# Patient Record
Sex: Female | Born: 1985
Health system: Southern US, Community
[De-identification: ages and names within clinical notes are randomized; demographics above are authoritative.]

## PROBLEM LIST (undated history)

## (undated) ENCOUNTER — Inpatient Hospital Stay (HOSPITAL_COMMUNITY): Payer: Self-pay

## (undated) DIAGNOSIS — J449 Chronic obstructive pulmonary disease, unspecified: Secondary | ICD-10-CM

## (undated) DIAGNOSIS — F319 Bipolar disorder, unspecified: Secondary | ICD-10-CM

## (undated) DIAGNOSIS — F419 Anxiety disorder, unspecified: Secondary | ICD-10-CM

## (undated) DIAGNOSIS — S82891A Other fracture of right lower leg, initial encounter for closed fracture: Secondary | ICD-10-CM

## (undated) DIAGNOSIS — S6291XA Unspecified fracture of right wrist and hand, initial encounter for closed fracture: Secondary | ICD-10-CM

## (undated) DIAGNOSIS — F4310 Post-traumatic stress disorder, unspecified: Secondary | ICD-10-CM

## (undated) HISTORY — PX: DILATION AND CURETTAGE OF UTERUS: SHX78

## (undated) HISTORY — PX: HEMORRHOID SURGERY: SHX153

## (undated) HISTORY — PX: OTHER SURGICAL HISTORY: SHX169

## (undated) HISTORY — PX: SHOULDER SURGERY: SHX246

---
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
## 2015-03-13 ENCOUNTER — Inpatient Hospital Stay (HOSPITAL_COMMUNITY)
Admission: AD | Admit: 2015-03-13 | Discharge: 2015-03-13 | Disposition: A | Discharge status: Home / Self Care | Payer: 59 | Source: Ambulatory Visit | Attending: Obstetrics and Gynecology | Admitting: Obstetrics and Gynecology

## 2015-03-13 ENCOUNTER — Encounter (HOSPITAL_COMMUNITY): Payer: Self-pay | Admitting: *Deleted

## 2015-03-13 DIAGNOSIS — Z3A08 8 weeks gestation of pregnancy: Secondary | ICD-10-CM | POA: Insufficient documentation

## 2015-03-13 DIAGNOSIS — R05 Cough: Secondary | ICD-10-CM | POA: Insufficient documentation

## 2015-03-13 DIAGNOSIS — Z87891 Personal history of nicotine dependence: Secondary | ICD-10-CM | POA: Insufficient documentation

## 2015-03-13 DIAGNOSIS — O210 Mild hyperemesis gravidarum: Secondary | ICD-10-CM | POA: Diagnosis not present

## 2015-03-13 DIAGNOSIS — O9989 Other specified diseases and conditions complicating pregnancy, childbirth and the puerperium: Secondary | ICD-10-CM | POA: Insufficient documentation

## 2015-03-13 DIAGNOSIS — R0981 Nasal congestion: Secondary | ICD-10-CM | POA: Diagnosis present

## 2015-03-13 HISTORY — DX: Anxiety disorder, unspecified: F41.9

## 2015-03-13 HISTORY — DX: Post-traumatic stress disorder, unspecified: F43.10

## 2015-03-13 HISTORY — DX: Unspecified fracture of right wrist and hand, initial encounter for closed fracture: S62.91XA

## 2015-03-13 HISTORY — DX: Other fracture of right lower leg, initial encounter for closed fracture: S82.891A

## 2015-03-13 LAB — URINE MICROSCOPIC-ADD ON

## 2015-03-13 LAB — URINALYSIS, ROUTINE W REFLEX MICROSCOPIC
Bilirubin Urine: NEGATIVE
Glucose, UA: NEGATIVE mg/dL
KETONES UR: 15 mg/dL — AB
LEUKOCYTES UA: NEGATIVE
NITRITE: NEGATIVE
PH: 7 (ref 5.0–8.0)
PROTEIN: NEGATIVE mg/dL
Specific Gravity, Urine: 1.02 (ref 1.005–1.030)
Urobilinogen, UA: 0.2 mg/dL (ref 0.0–1.0)

## 2015-03-13 MED ORDER — PROMETHAZINE HCL 25 MG/ML IJ SOLN
25.0000 mg | Freq: Once | INTRAVENOUS | Status: AC
  Administered 2015-03-13: 25 mg via INTRAVENOUS
  Filled 2015-03-13: qty 1

## 2015-03-13 MED ORDER — PROMETHAZINE HCL 25 MG PO TABS: 12.5000 mg | ORAL_TABLET | Freq: Four times a day (QID) | ORAL | Status: DC | PRN

## 2015-03-13 NOTE — Discharge Instructions (Signed)
Take the phenergan for nausea and be sure you are drinking plenty of fluids, call the office for a follow up appointment. Return here as needed.

## 2015-03-13 NOTE — MAU Note (Addendum)
Pre-existing problem with n/v.  Got a sinus infection and now feels worse, can't even keep sprite down. Is on rx for sinus infection. Had temp up to 102 on Sunday
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
## 2015-03-14 ENCOUNTER — Inpatient Hospital Stay (HOSPITAL_COMMUNITY)
Admission: AD | Admit: 2015-03-14 | Discharge: 2015-03-15 | Disposition: A | Discharge status: Home / Self Care | Payer: 59 | Source: Ambulatory Visit | Attending: Obstetrics and Gynecology | Admitting: Obstetrics and Gynecology

## 2015-03-14 DIAGNOSIS — R748 Abnormal levels of other serum enzymes: Secondary | ICD-10-CM | POA: Insufficient documentation

## 2015-03-14 DIAGNOSIS — Z87891 Personal history of nicotine dependence: Secondary | ICD-10-CM | POA: Insufficient documentation

## 2015-03-14 DIAGNOSIS — Z3A08 8 weeks gestation of pregnancy: Secondary | ICD-10-CM | POA: Insufficient documentation

## 2015-03-14 DIAGNOSIS — O211 Hyperemesis gravidarum with metabolic disturbance: Secondary | ICD-10-CM | POA: Diagnosis not present

## 2015-03-14 DIAGNOSIS — O219 Vomiting of pregnancy, unspecified: Secondary | ICD-10-CM

## 2015-03-14 DIAGNOSIS — O210 Mild hyperemesis gravidarum: Secondary | ICD-10-CM | POA: Diagnosis present

## 2015-03-14 LAB — URINALYSIS, ROUTINE W REFLEX MICROSCOPIC
BILIRUBIN URINE: NEGATIVE
Glucose, UA: NEGATIVE mg/dL
HGB URINE DIPSTICK: NEGATIVE
KETONES UR: 15 mg/dL — AB
Leukocytes, UA: NEGATIVE
Nitrite: NEGATIVE
PH: 6 (ref 5.0–8.0)
Protein, ur: NEGATIVE mg/dL
Specific Gravity, Urine: 1.025 (ref 1.005–1.030)
UROBILINOGEN UA: 1 mg/dL (ref 0.0–1.0)

## 2015-03-14 LAB — CBC
HEMATOCRIT: 38.9 % (ref 36.0–46.0)
HEMOGLOBIN: 13.4 g/dL (ref 12.0–15.0)
MCH: 29.1 pg (ref 26.0–34.0)
MCHC: 34.4 g/dL (ref 30.0–36.0)
MCV: 84.6 fL (ref 78.0–100.0)
Platelets: 240 10*3/uL (ref 150–400)
RBC: 4.6 MIL/uL (ref 3.87–5.11)
RDW: 12.7 % (ref 11.5–15.5)
WBC: 9.1 10*3/uL (ref 4.0–10.5)

## 2015-03-14 MED ORDER — LACTATED RINGERS IV BOLUS (SEPSIS)
1000.0000 mL | Freq: Once | INTRAVENOUS | Status: AC
  Administered 2015-03-14: 1000 mL via INTRAVENOUS

## 2015-03-14 MED ORDER — ONDANSETRON 8 MG/NS 50 ML IVPB
8.0000 mg | Freq: Once | INTRAVENOUS | Status: AC
  Administered 2015-03-14: 8 mg via INTRAVENOUS
  Filled 2015-03-14: qty 8
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
## 2015-03-14 NOTE — MAU Note (Signed)
Denies abdominal pain or vaginal bleeding. Upper abdominal soreness r/t vomiting so much.  Vomiting since Sunday; lost count of how many times vomited in the last day.  Taking reglan & phenergan at home with no relief.  Denies diarrhea.

## 2015-03-15 DIAGNOSIS — O219 Vomiting of pregnancy, unspecified: Secondary | ICD-10-CM | POA: Diagnosis not present

## 2015-03-15 LAB — COMPREHENSIVE METABOLIC PANEL
ALK PHOS: 63 U/L (ref 39–117)
ALT: 78 U/L — AB (ref 0–35)
AST: 78 U/L — AB (ref 0–37)
Albumin: 4.5 g/dL (ref 3.5–5.2)
Anion gap: 8 (ref 5–15)
BUN: 7 mg/dL (ref 6–23)
CHLORIDE: 104 mmol/L (ref 96–112)
CO2: 25 mmol/L (ref 19–32)
Calcium: 9.3 mg/dL (ref 8.4–10.5)
Creatinine, Ser: 0.48 mg/dL — ABNORMAL LOW (ref 0.50–1.10)
GFR calc Af Amer: >90 mL/min (ref >90)
GFR calc non Af Amer: >90 mL/min (ref >90)
Glucose, Bld: 99 mg/dL (ref 70–99)
Potassium: 3.5 mmol/L (ref 3.5–5.1)
Sodium: 137 mmol/L (ref 135–145)
Total Bilirubin: 0.8 mg/dL (ref 0.3–1.2)
Total Protein: 7.2 g/dL (ref 6.0–8.3)

## 2015-03-15 LAB — HEPATITIS PANEL, ACUTE
HCV Ab: NEGATIVE
HEP B C IGM: NONREACTIVE
HEP B S AG: NEGATIVE
Hep A IgM: NONREACTIVE

## 2015-03-15 LAB — TSH: TSH: 0.109 u[IU]/mL — ABNORMAL LOW (ref 0.350–4.500)

## 2015-03-15 MED ORDER — ONDANSETRON 8 MG PO TBDP: 8.0000 mg | ORAL_TABLET | Freq: Three times a day (TID) | ORAL | Status: DC | PRN

## 2015-03-15 NOTE — Discharge Instructions (Signed)
Your liver enzymes were slightly elevated today. This is likely related to your nausea and vomiting, but we are running tests for liver disease as well. Your doctor should retest you liver enzymes in the next month to see if have normalized.   Hyperemesis Gravidarum Hyperemesis gravidarum is a severe form of nausea and vomiting that happens during pregnancy. Hyperemesis is worse than morning sickness. It may cause you to have nausea or vomiting all day for many days. It may keep you from eating and drinking enough food and liquids. Hyperemesis usually occurs during the first half (the first 20 weeks) of pregnancy. It often goes away once a woman is in her second half of pregnancy. However, sometimes hyperemesis continues through an entire pregnancy.  CAUSES  The cause of this condition is not completely known but is thought to be related to changes in the body's hormones when pregnant. It could be from the high level of the pregnancy hormone or an increase in estrogen in the body.  SIGNS AND SYMPTOMS   Severe nausea and vomiting.  Nausea that does not go away.  Vomiting that does not allow you to keep any food down.  Weight loss and body fluid loss (dehydration).  Having no desire to eat or not liking food you have previously enjoyed. DIAGNOSIS  Your health care provider will do a physical exam and ask you about your symptoms. He or she may also order blood tests and urine tests to make sure something else is not causing the problem.  TREATMENT  You may only need medicine to control the problem. If medicines do not control the nausea and vomiting, you will be treated in the hospital to prevent dehydration, increased acid in the blood (acidosis), weight loss, and changes in the electrolytes in your body that may harm the unborn baby (fetus). You may need IV fluids.  HOME CARE INSTRUCTIONS   Only take over-the-counter or prescription medicines as directed by your health care provider.  Try  eating a couple of dry crackers or toast in the morning before getting out of bed.  Avoid foods and smells that upset your stomach.  Avoid fatty and spicy foods.  Eat 5-6 small meals a day.  Do not drink when eating meals. Drink between meals.  For snacks, eat high-protein foods, such as cheese.  Eat or suck on things that have ginger in them. Ginger helps nausea.  Avoid food preparation. The smell of food can spoil your appetite.  Avoid iron pills and iron in your multivitamins until after 3-4 months of being pregnant. However, consult with your health care provider before stopping any prescribed iron pills. SEEK MEDICAL CARE IF:   Your abdominal pain increases.  You have a severe headache.  You have vision problems.  You are losing weight. SEEK IMMEDIATE MEDICAL CARE IF:   You are unable to keep fluids down.  You vomit blood.  You have constant nausea and vomiting.  You have excessive weakness.  You have extreme thirst.  You have dizziness or fainting.  You have a fever or persistent symptoms for more than 2-3 days.  You have a fever and your symptoms suddenly get worse. MAKE SURE YOU:   Understand these instructions.  Will watch your condition.  Will get help right away if you are not doing well or get worse. Document Released: 12/15/2005 Document Revised: 10/05/2013 Document Reviewed: 07/27/2013 [facility] Patient Information 2015 [employer]. This information is not intended to replace advice given to you by  your health care provider. Make sure you discuss any questions you have with your health care provider.

## 2015-04-03 LAB — OB RESULTS CONSOLE GC/CHLAMYDIA
Chlamydia: NEGATIVE
GC PROBE AMP, GENITAL: NEGATIVE

## 2015-04-03 LAB — OB RESULTS CONSOLE RUBELLA ANTIBODY, IGM: RUBELLA: IMMUNE

## 2015-04-03 LAB — OB RESULTS CONSOLE ABO/RH: RH TYPE: POSITIVE

## 2015-04-03 LAB — OB RESULTS CONSOLE HIV ANTIBODY (ROUTINE TESTING): HIV: NONREACTIVE

## 2015-04-03 LAB — OB RESULTS CONSOLE RPR: RPR: NONREACTIVE

## 2015-04-03 LAB — OB RESULTS CONSOLE ANTIBODY SCREEN: ANTIBODY SCREEN: NEGATIVE

## 2015-05-10 ENCOUNTER — Inpatient Hospital Stay (HOSPITAL_COMMUNITY): Payer: 59

## 2015-05-10 ENCOUNTER — Encounter (HOSPITAL_COMMUNITY): Payer: Self-pay | Admitting: *Deleted

## 2015-05-10 ENCOUNTER — Inpatient Hospital Stay (HOSPITAL_COMMUNITY)
Admission: AD | Admit: 2015-05-10 | Discharge: 2015-05-20 | DRG: 165 | Disposition: A | Discharge status: Home / Self Care | Payer: 59 | Source: Ambulatory Visit | Attending: Surgery | Admitting: Surgery

## 2015-05-10 DIAGNOSIS — O99512 Diseases of the respiratory system complicating pregnancy, second trimester: Secondary | ICD-10-CM | POA: Diagnosis present

## 2015-05-10 DIAGNOSIS — J9382 Other air leak: Secondary | ICD-10-CM | POA: Diagnosis present

## 2015-05-10 DIAGNOSIS — F4310 Post-traumatic stress disorder, unspecified: Secondary | ICD-10-CM | POA: Diagnosis present

## 2015-05-10 DIAGNOSIS — O210 Mild hyperemesis gravidarum: Secondary | ICD-10-CM | POA: Diagnosis present

## 2015-05-10 DIAGNOSIS — R079 Chest pain, unspecified: Secondary | ICD-10-CM

## 2015-05-10 DIAGNOSIS — J982 Interstitial emphysema: Secondary | ICD-10-CM | POA: Diagnosis not present

## 2015-05-10 DIAGNOSIS — J9383 Other pneumothorax: Principal | ICD-10-CM | POA: Diagnosis present

## 2015-05-10 DIAGNOSIS — F17210 Nicotine dependence, cigarettes, uncomplicated: Secondary | ICD-10-CM | POA: Diagnosis present

## 2015-05-10 DIAGNOSIS — Z79899 Other long term (current) drug therapy: Secondary | ICD-10-CM

## 2015-05-10 DIAGNOSIS — J939 Pneumothorax, unspecified: Secondary | ICD-10-CM

## 2015-05-10 DIAGNOSIS — Z9689 Presence of other specified functional implants: Secondary | ICD-10-CM

## 2015-05-10 DIAGNOSIS — M79609 Pain in unspecified limb: Secondary | ICD-10-CM | POA: Diagnosis not present

## 2015-05-10 DIAGNOSIS — Z3A17 17 weeks gestation of pregnancy: Secondary | ICD-10-CM | POA: Diagnosis present

## 2015-05-10 DIAGNOSIS — R0602 Shortness of breath: Secondary | ICD-10-CM

## 2015-05-10 LAB — COMPREHENSIVE METABOLIC PANEL
ALBUMIN: 3.8 g/dL (ref 3.5–5.0)
ALT: 15 U/L (ref 14–54)
ANION GAP: 10 (ref 5–15)
AST: 16 U/L (ref 15–41)
Alkaline Phosphatase: 49 U/L (ref 38–126)
BILIRUBIN TOTAL: 0.5 mg/dL (ref 0.3–1.2)
CALCIUM: 8.7 mg/dL — AB (ref 8.9–10.3)
CHLORIDE: 103 mmol/L (ref 101–111)
CO2: 24 mmol/L (ref 22–32)
CREATININE: 0.58 mg/dL (ref 0.44–1.00)
GFR calc Af Amer: >60 mL/min (ref >60)
Glucose, Bld: 132 mg/dL — ABNORMAL HIGH (ref 65–99)
Potassium: 3.3 mmol/L — ABNORMAL LOW (ref 3.5–5.1)
Sodium: 137 mmol/L (ref 135–145)
Total Protein: 6.5 g/dL (ref 6.5–8.1)

## 2015-05-10 LAB — CBC WITH DIFFERENTIAL/PLATELET
BASOS ABS: 0 10*3/uL (ref 0.0–0.1)
Basophils Relative: 0 % (ref 0–1)
Eosinophils Absolute: 0.1 10*3/uL (ref 0.0–0.7)
Eosinophils Relative: 1 % (ref 0–5)
HEMATOCRIT: 35.6 % — AB (ref 36.0–46.0)
HEMOGLOBIN: 12.3 g/dL (ref 12.0–15.0)
LYMPHS ABS: 2.1 10*3/uL (ref 0.7–4.0)
LYMPHS PCT: 23 % (ref 12–46)
MCH: 30.1 pg (ref 26.0–34.0)
MCHC: 34.6 g/dL (ref 30.0–36.0)
MCV: 87.3 fL (ref 78.0–100.0)
Monocytes Absolute: 0.5 10*3/uL (ref 0.1–1.0)
Monocytes Relative: 5 % (ref 3–12)
NEUTROS ABS: 6.5 10*3/uL (ref 1.7–7.7)
Neutrophils Relative %: 71 % (ref 43–77)
PLATELETS: 222 10*3/uL (ref 150–400)
RBC: 4.08 MIL/uL (ref 3.87–5.11)
RDW: 14.3 % (ref 11.5–15.5)
WBC: 9.1 10*3/uL (ref 4.0–10.5)

## 2015-05-10 LAB — MRSA PCR SCREENING: MRSA by PCR: NEGATIVE

## 2015-05-10 LAB — TROPONIN I: Troponin I: <0.03 ng/mL (ref <0.031)

## 2015-05-10 MED ORDER — LACTATED RINGERS IV BOLUS (SEPSIS)
1000.0000 mL | Freq: Once | INTRAVENOUS | Status: AC
  Administered 2015-05-10: 1000 mL via INTRAVENOUS

## 2015-05-10 MED ORDER — ACETAMINOPHEN 325 MG PO TABS
650.0000 mg | ORAL_TABLET | Freq: Once | ORAL | Status: AC
Start: 2015-05-10 — End: 2015-05-10
  Administered 2015-05-10: 650 mg via ORAL
  Filled 2015-05-10: qty 2

## 2015-05-10 MED ORDER — COMPLETENATE 29-1 MG PO CHEW
1.0000 | CHEWABLE_TABLET | Freq: Every day | ORAL | Status: DC
  Filled 2015-05-10: qty 1

## 2015-05-10 MED ORDER — ONDANSETRON HCL 4 MG/2ML IJ SOLN
INTRAMUSCULAR | Status: AC
  Filled 2015-05-10: qty 2

## 2015-05-10 MED ORDER — PRENATAL MULTIVITAMIN CH
1.0000 | ORAL_TABLET | Freq: Every day | ORAL | Status: DC
  Administered 2015-05-11 – 2015-05-19 (×9): 1 via ORAL
  Filled 2015-05-10 (×12): qty 1

## 2015-05-10 MED ORDER — OXYCODONE HCL 5 MG PO TABS
5.0000 mg | ORAL_TABLET | ORAL | Status: DC | PRN
  Administered 2015-05-10 – 2015-05-12 (×7): 5 mg via ORAL
  Filled 2015-05-10 (×8): qty 1

## 2015-05-10 MED ORDER — ONDANSETRON HCL 4 MG/2ML IJ SOLN
4.0000 mg | Freq: Three times a day (TID) | INTRAMUSCULAR | Status: DC | PRN
  Administered 2015-05-10 – 2015-05-14 (×8): 4 mg via INTRAVENOUS
  Filled 2015-05-10 (×8): qty 2

## 2015-05-10 MED ORDER — LIDOCAINE HCL (PF) 1 % IJ SOLN
INTRAMUSCULAR | Status: AC
  Administered 2015-05-10: 10 mL
  Filled 2015-05-10: qty 10

## 2015-05-10 MED ORDER — ACETAMINOPHEN 325 MG PO TABS
650.0000 mg | ORAL_TABLET | Freq: Four times a day (QID) | ORAL | Status: DC | PRN
  Administered 2015-05-11 – 2015-05-13 (×5): 650 mg via ORAL
  Filled 2015-05-10 (×6): qty 2

## 2015-05-10 MED ORDER — FENTANYL CITRATE (PF) 100 MCG/2ML IJ SOLN
50.0000 ug | INTRAMUSCULAR | Status: DC | PRN
  Administered 2015-05-10 – 2015-05-15 (×35): 50 ug via INTRAVENOUS
  Filled 2015-05-10 (×34): qty 2

## 2015-05-10 MED ORDER — ONDANSETRON HCL 4 MG/2ML IJ SOLN
4.0000 mg | Freq: Once | INTRAMUSCULAR | Status: AC
  Administered 2015-05-10: 4 mg via INTRAVENOUS

## 2015-05-10 MED ORDER — PRENATAL LOW IRON 27-1 MG PO TABS
1.0000 | ORAL_TABLET | Freq: Every day | ORAL | Status: DC
  Filled 2015-05-10: qty 1

## 2015-05-10 NOTE — ED Notes (Signed)
Consulting MD at bedside

## 2015-05-10 NOTE — ED Notes (Signed)
Dr. Laneta SimmersBartle at bedside preparing to place R chest tube.

## 2015-05-10 NOTE — MAU Note (Signed)
10 L O2 via NRB applied. Pulse ox applied.
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
## 2015-05-10 NOTE — MAU Note (Signed)
EMS ETA: 10 minutes  EMS arrival :  1334 EMS departure : 1343  113/57 92 p 98 on 10L NRB  20RR

## 2015-05-10 NOTE — Progress Notes (Signed)
UR COMPLETED  

## 2015-05-10 NOTE — ED Notes (Signed)
Per [facility]- pt had sudden onset of SOB and back pain. Pt reports recent nonproductive cough in the mornings. Pt reported to have absent breath sounds on the rt. spO2 decreased with standing and HR increased. Pt on 10L simple mask. Pt is [redacted] weeks pregnant. Pt reported to have 1 episode of vomiting prior to transport.

## 2015-05-10 NOTE — CV Procedure (Signed)
Chest Tube Insertion Procedure Note  Indications:  Clinically significant Tension Right Pneumothorax  Pre-operative Diagnosis: Pneumothorax  Post-operative Diagnosis: Pneumothorax  Procedure Details  Informed consent was obtained for the procedure, including sedation.  Risks of lung perforation, hemorrhage, arrhythmia, and adverse drug reaction were discussed.   After sterile skin prep, using standard technique, a 20 French tube was placed in the right lateral 6th rib space.  Findings: None  Estimated Blood Loss:  Minimal         Specimens:  None              Complications:  None; patient tolerated the procedure well.         Disposition: admitted from ER         Condition: stable  Attending Attestation: I performed the procedure.

## 2015-05-10 NOTE — MAU Note (Signed)
Dr Ellyn HackBovard, MD at bedside
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
## 2015-05-10 NOTE — MAU Note (Signed)
Respiratory  At Bedside for EKG

## 2015-05-10 NOTE — MAU Note (Signed)
Pt to MAU via EMS for chest tightness, upper right back pain that radiates to neck. History of PTSD. Denies allergies or asthma.

## 2015-05-11 ENCOUNTER — Inpatient Hospital Stay (HOSPITAL_COMMUNITY): Payer: 59

## 2015-05-11 MED ORDER — BOOST / RESOURCE BREEZE PO LIQD
1.0000 | Freq: Three times a day (TID) | ORAL | Status: DC
  Administered 2015-05-11 – 2015-05-19 (×4): 1 via ORAL

## 2015-05-11 NOTE — Progress Notes (Signed)
Initial Nutrition Assessment  DOCUMENTATION CODES:  Not applicable  INTERVENTION:   Boost Breeze PO TID, each supplement provides 250 kcal and 9 grams of protein  NUTRITION DIAGNOSIS:  Inadequate oral intake related to altered GI function as evidenced by per patient/family report.   GOAL:  Patient will meet greater than or equal to 90% of their needs   MONITOR:  PO intake, Supplement acceptance, Weight trends, Labs  REASON FOR ASSESSMENT:  Malnutrition Screening Tool    ASSESSMENT: Patient admitted with SOB and chest pain related to right sided pneumothorax. S/P chest tube placement.   Patient is [redacted] weeks pregnant. She reports that she has had hyperemesis gravidarium since March, nausea has been controlled since starting on Zofran. Intake good at home, but she has lost ~8 lbs since before current pregnancy. Since admission has had a poor appetite with ongoing nausea, vomiting, and poor oral intake. She agreed to try Boost Breeze supplement to increase intake of calories and protein. Nutrition focused physical exam completed.  No muscle or subcutaneous fat depletion noticed.  Height:  Ht Readings from Last 1 Encounters:  05/10/15 5\' 3"  (1.6 m)    Weight:  Wt Readings from Last 1 Encounters:  05/10/15 131 lb 13.4 oz (59.8 kg)    Wt Readings from Last 10 Encounters:  05/10/15 131 lb 13.4 oz (59.8 kg)  03/14/15 128 lb 12.8 oz (58.423 kg)  03/13/15 129 lb (58.514 kg)    BMI:  Body mass index is 23.36 kg/(m^2).  Estimated Nutritional Needs:  Kcal:  1800-2000  Protein:  75-90 gm  Fluid:  2 L  Skin:  Reviewed, no issues  Diet Order:  Diet regular Room service appropriate?: Yes; Fluid consistency:: Thin  EDUCATION NEEDS:  Education needs addressed   Intake/Output Summary (Last 24 hours) at 05/11/15 1405 Last data filed at 05/11/15 0044  Gross per 24 hour  Intake      0 ml  Output    300 ml  Net   -300 ml    Last BM:  Unknown   Joaquin CourtsKimberly Uldine Fuster,  RD, LDN, CNSC Pager [phone] After Hours Pager [phone]

## 2015-05-11 NOTE — Progress Notes (Addendum)
[address]       [place] [id number]             [phone]          Subjective: C/O pain from chest tube  Objective: Vital signs in last 24 hours: Temp:  [97.5 F (36.4 C)-98.6 F (37 C)] 98.2 F (36.8 C) (05/13 0700) Pulse Rate:  [69-93] 86 (05/13 0320) Cardiac Rhythm:  [-] Normal sinus rhythm (05/13 0320) Resp:  [18-32] 24 (05/13 0320) BP: (104-131)/(47-70) 116/63 mmHg (05/13 0320) SpO2:  [89 %-100 %] 98 % (05/13 0320) Weight:  [131 lb 13.4 oz (59.8 kg)] 131 lb 13.4 oz (59.8 kg) (05/12 1700)  Hemodynamic parameters for last 24 hours:    Intake/Output from previous day: 05/12 0701 - 05/13 0700 In: -  Out: 300 [Urine:300] Intake/Output this shift:    General appearance: alert, cooperative and no distress Heart: regular rate and rhythm Lungs: clear to auscultation bilaterally Abdomen: soft, non-tender  Lab Results:  Recent Labs  05/10/15 1255  WBC 9.1  HGB 12.3  HCT 35.6*  PLT 222   BMET:  Recent Labs  05/10/15 1255  NA 137  K 3.3*  CL 103  CO2 24  GLUCOSE 132*  BUN <5*  CREATININE 0.58  CALCIUM 8.7*    PT/INR: No results for input(s): LABPROT, INR in the last 72 hours. ABG No results found for: PHART, HCO3, TCO2, ACIDBASEDEF, O2SAT CBG (last 3)  No results for input(s): GLUCAP in the last 72 hours.  Meds Scheduled Meds: . prenatal multivitamin  1 tablet Oral QHS   Continuous Infusions:  PRN Meds:.acetaminophen, fentaNYL (SUBLIMAZE) injection, ondansetron (ZOFRAN) IV, oxyCODONE  Xrays Dg Chest Port 1 View  05/11/2015   CLINICAL DATA:  Recent pneumothorax on right  EXAM: PORTABLE CHEST - 1 VIEW  COMPARISON:  May 10, 2015 chest radiographs  FINDINGS: There is a chest tube on the right medially. The previously noted large pneumothorax in the right has resolved without apparent pneumothorax currently. There is airspace consolidation in the right lower lobe with a significant decrease in apparent re-expansion pulmonary  edema on the right compared to earlier in the day. Left lung is clear. Heart size and pulmonary vascularity are normal. No adenopathy. No bone lesions.  IMPRESSION: Chest tube remains on the right with without recurrence of pneumothorax. There is airspace consolidation in the right lower lobe. There is less re-expansion edema on the right compared to 1 day prior. Left lung remains clear. No change in cardiac silhouette.   Electronically Signed   By: Bretta BangWilliam  Woodruff III M.D.   On: 05/11/2015 07:53   Dg Chest Portable 1 View  05/10/2015   CLINICAL DATA:  Chest tube insertion  EXAM: PORTABLE CHEST - 1 VIEW  COMPARISON:  05/10/2015  FINDINGS: Right chest tube placement is medial. Interval re-expansion of the right lung. Re-expansion edema throughout the right lower lobe.  Left lung remains clear.  Heart size is normal.  IMPRESSION: Right medial chest tube placement with re-expansion of the right lung. Minimal residual pneumothorax. Re-expansion pulmonary edema on the right.   Electronically Signed   By: Marlan Palauharles  Clark M.D.   On: 05/10/2015 15:55   Dg Chest Portable 1 View  05/10/2015   CLINICAL DATA:  29 year old female with shortness breath back pain. Sixteen weeks pregnant. Initial encounter.  EXAM: PORTABLE CHEST - 1 VIEW  COMPARISON:  01/21/2015.  FINDINGS: Large right-sided tension pneumothorax with complete collapse of the lung  displaced medially and inferiorly. Slight downward displacement of the right hemidiaphragm and slight mediastinal shift to the left.  Heart size difficult adequately assessed.  IMPRESSION: Complete collapse of the right lung consistent with tension pneumothorax as noted above.  These results were called by telephone at the time of interpretation on 05/10/2015 at 2:38 pm to Dr. Julieanne Mansonegler and [facility] DOCHERTY , who verbally acknowledged these results.   Electronically Signed   By: Lacy DuverneySteven  Olson M.D.   On: 05/10/2015 14:45   Chest tube-+ air leak   Assessment/Plan: S/P right chest tube  for spont pntx- small air leak, cont to suction Analgesics prn Will order an incentive spirometer    LOS: 1 day    GOLD,WAYNE E 05/11/2015   Chart reviewed, patient examined, agree with above. She is taking fentanyl and oxycodone for significant pain since tylenol was inadequate. Try to keep to a minimum since she is pregnant.
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
## 2015-05-11 NOTE — Progress Notes (Signed)
Pt concerned that she hasnt felt baby move very much today.  Fetal heart tones done by ED RN, heart beat 174.  Pt states she is relieved.

## 2015-05-12 ENCOUNTER — Inpatient Hospital Stay (HOSPITAL_COMMUNITY): Payer: 59

## 2015-05-12 NOTE — Progress Notes (Addendum)
[address]       [place] [id number]             [phone]               Subjective: Still having quite a bit of pain at CT site.  Breathing stable.   Objective: Vital signs in last 24 hours: Patient Vitals for the past 24 hrs:  BP Temp Temp src Pulse Resp SpO2  05/12/15 0722 - 98.4 F (36.9 C) Oral - - -  05/12/15 0444 114/64 mmHg 98.4 F (36.9 C) Oral 76 (!) 22 93 %  05/11/15 2318 123/63 mmHg 98.2 F (36.8 C) Oral 74 20 99 %  05/11/15 1922 108/63 mmHg 98.6 F (37 C) Oral 71 18 98 %  05/11/15 1550 - - - 78 17 99 %  05/11/15 1500 - 98.5 F (36.9 C) Oral - - -  05/11/15 1418 108/68 mmHg - - 72 (!) 21 98 %  05/11/15 1100 - 98.8 F (37.1 C) Oral - - -  05/11/15 1050 115/65 mmHg - - 79 18 96 %   Current Weight  05/10/15 131 lb 13.4 oz (59.8 kg)     Intake/Output from previous day: 05/13 0701 - 05/14 0700 In: 390 [P.O.:390] Out: 830 [Urine:800; Chest Tube:30]    PHYSICAL EXAM:  Heart: RRR Lungs: Clear Chest tube: 1/7 intermittent air leak    Lab Results: CBC: Recent Labs  05/10/15 1255  WBC 9.1  HGB 12.3  HCT 35.6*  PLT 222   BMET:  Recent Labs  05/10/15 1255  NA 137  K 3.3*  CL 103  CO2 24  GLUCOSE 132*  BUN <5*  CREATININE 0.58  CALCIUM 8.7*    PT/INR: No results for input(s): LABPROT, INR in the last 72 hours.  CXR: FINDINGS: There is a small a apical pneumothorax on the right. No left pneumothorax. No pleural effusion. Mild hazy airspace opacity noted adjacent to the right hilum extending towards the right lung base. There is discoid opacity at the right lung base consistent with atelectasis. Lung opacity has improved since the previous day's study. Left lung remains clear.  Right chest tube stable with its tip along the right medial mediastinal pleura at the level of the aortic arch.  Subcutaneous emphysema is now evident over the right lateral chest wall.  IMPRESSION: 1. Small apical  pneumothorax. This may not be new from the previous day's study, but better visualized on the current exam due to differences in patient positioning and technique. 2. Improved lung aeration on the right with decreased opacity in the mid to lower lung zone. 3. Stable right chest tube. 4. New or increased right-sided subcutaneous emphysema.   Assessment/Plan: Continues to have a small air leak, CT with stable small apical ptx.  Will leave CT to suction for now. Pain control seems to be primary issue.  She is taking Fentany/Oxy regularly.  Try to minimize narcotics as much as possible due to pregnancy.   LOS: 2 days    COLLINS,GINA H 05/12/2015   Chart reviewed, patient examined, agree with above. Still has an air leak and it looks like it may be a little more today than I have seen before. Will plan to continue chest tube on 20 suction and if she still has an air leak on Monday we may need to consider VATS. Will have to discuss this with her OB first. The Oxy is making her nauseous so  will stick with Fentanyl.

## 2015-05-13 ENCOUNTER — Inpatient Hospital Stay (HOSPITAL_COMMUNITY): Payer: 59

## 2015-05-13 NOTE — Progress Notes (Addendum)
       [address]       [place] [id number]             [phone]               Subjective: Feels a little better this am.  Less nausea, pain control adequate.   Objective: Vital signs in last 24 hours: Patient Vitals for the past 24 hrs:  BP Temp Temp src Pulse Resp SpO2  05/13/15 0327 110/62 mmHg 98.2 F (36.8 C) Oral 74 16 98 %  05/12/15 2321 (!) 107/50 mmHg 98.6 F (37 C) Oral 71 15 94 %  05/12/15 2003 114/62 mmHg 98.8 F (37.1 C) Oral 76 18 98 %  05/12/15 1604 (!) 114/57 mmHg 98.3 F (36.8 C) Oral 74 (!) 21 97 %  05/12/15 1100 - 98.3 F (36.8 C) Oral - - -   Current Weight  05/10/15 131 lb 13.4 oz (59.8 kg)     Intake/Output from previous day: 05/14 0701 - 05/15 0700 In: 360 [P.O.:360] Out: 200 [Urine:200]    PHYSICAL EXAM:  Heart: RRR Lungs: Clear Chest tube: 1/7 intermittent air leak    Lab Results: CBC: Recent Labs  05/10/15 1255  WBC 9.1  HGB 12.3  HCT 35.6*  PLT 222   BMET:  Recent Labs  05/10/15 1255  NA 137  K 3.3*  CL 103  CO2 24  GLUCOSE 132*  BUN <5*  CREATININE 0.58  CALCIUM 8.7*    PT/INR: No results for input(s): LABPROT, INR in the last 72 hours.    Assessment/Plan: S/P  R CT for spontaneous ptx- CXR not done yet this am. On exam, she continues to have a small intermittent air leak that does not worsen with cough.  Will follow up CXR and leave on suction for now. May ultimately need VATS if air leak persists.    LOS: 3 days    COLLINS,GINA H 05/13/2015   Chart reviewed, patient examined, agree with above. Still has an air leak and I suspect that she will need VATS. If still leaking in the morning I will call her OB to discuss surgery.

## 2015-05-13 NOTE — Progress Notes (Signed)
UR COMPLETED  

## 2015-05-14 ENCOUNTER — Inpatient Hospital Stay (HOSPITAL_COMMUNITY): Payer: 59

## 2015-05-14 MED ORDER — DEXTROSE 5 % IV SOLN
1.5000 g | INTRAVENOUS | Status: AC
  Administered 2015-05-15: 1.5 g via INTRAVENOUS
  Filled 2015-05-14 (×2): qty 1.5

## 2015-05-14 NOTE — Progress Notes (Addendum)
      [address]       [place] [id number]             [phone]      Subjective:  Ms. [name] [name] is doing okay this morning.  States her pain is okay unless she moves around a lot.    Objective: Vital signs in last 24 hours: Temp:  [98 F (36.7 C)-98.6 F (37 C)] 98.1 F (36.7 C) (05/16 0800) Pulse Rate:  [74-104] 79 (05/16 0800) Cardiac Rhythm:  [-] Normal sinus rhythm (05/16 0800) Resp:  [14-22] 22 (05/16 0800) BP: (96-118)/(51-78) 96/51 mmHg (05/16 0800) SpO2:  [97 %-100 %] 100 % (05/16 0800)  Intake/Output from previous day: 05/15 0701 - 05/16 0700 In: 1440 [P.O.:1440] Out: 350 [Urine:350] Intake/Output this shift: Total I/O In: 360 [P.O.:360] Out: 0   General appearance: alert, cooperative and no distress Heart: regular rate and rhythm Lungs: clear to auscultation bilaterally Abdomen: soft, non-tender; bowel sounds normal; no masses,  no organomegaly Wound: clean and dry  Lab Results: No results for input(s): WBC, HGB, HCT, PLT in the last 72 hours. BMET: No results for input(s): NA, K, CL, CO2, GLUCOSE, BUN, CREATININE, CALCIUM in the last 72 hours.  PT/INR: No results for input(s): LABPROT, INR in the last 72 hours. ABG No results found for: PHART, HCO3, TCO2, ACIDBASEDEF, O2SAT CBG (last 3)  No results for input(s): GLUCAP in the last 72 hours.  Assessment/Plan:  1. Chest tube- continued air leak with cough- leave on suction 2. Pulm- not on oxygen, CXR without pneumothorax, + sub cutaneous emphysema 3. Dispo- patient stable, will likely need VATS, leave chest tube to suction   LOS: 4 days    [name] [name], ERIN 05/14/2015   Chart reviewed, patient examined, agree with above. She still has an air leak with cough that is 3-4 chambers. CXR looks ok. I think a VATS would give her the best chance of permanently resolving this air leak and significantly decrease the chance of it occuring again later in her pregnancy. I discussed her case with  Dr. Tracey Harrieshomas Henley, her OB and she is [redacted] wks pregnant so he feels that this would be the best time to do the surgery if it needs to be done. All of the baby's organs are formed. He just recommended minimizing medications as possible. I discussed this with her and the option of continued chest tube drainage. I discussed the risks of surgery including but not limited to bleeding, infection, recurrent ptx and she understands and would like to proceed with surgery. I will do this in the am.

## 2015-05-15 ENCOUNTER — Inpatient Hospital Stay (HOSPITAL_COMMUNITY): Payer: 59

## 2015-05-15 ENCOUNTER — Inpatient Hospital Stay (HOSPITAL_COMMUNITY): Payer: 59 | Admitting: Anesthesiology

## 2015-05-15 ENCOUNTER — Encounter (HOSPITAL_COMMUNITY): Payer: Self-pay | Admitting: Anesthesiology

## 2015-05-15 ENCOUNTER — Encounter (HOSPITAL_COMMUNITY)
Admission: AD | Disposition: A | Discharge status: Home / Self Care | Payer: Self-pay | Source: Ambulatory Visit | Attending: Surgery

## 2015-05-15 DIAGNOSIS — J9383 Other pneumothorax: Secondary | ICD-10-CM

## 2015-05-15 HISTORY — PX: STAPLING OF BLEBS: SHX6429

## 2015-05-15 HISTORY — PX: VIDEO ASSISTED THORACOSCOPY: SHX5073

## 2015-05-15 LAB — GLUCOSE, CAPILLARY
Glucose-Capillary: 115 mg/dL — ABNORMAL HIGH (ref 65–99)
Glucose-Capillary: 153 mg/dL — ABNORMAL HIGH (ref 65–99)
Glucose-Capillary: 137 mg/dL — ABNORMAL HIGH (ref 65–99)
Glucose-Capillary: 109 mg/dL — ABNORMAL HIGH (ref 65–99)

## 2015-05-15 SURGERY — VIDEO ASSISTED THORACOSCOPY
Anesthesia: General | Site: Chest | Laterality: Right

## 2015-05-15 MED ORDER — ARTIFICIAL TEARS OP OINT
TOPICAL_OINTMENT | OPHTHALMIC | Status: DC | PRN
  Administered 2015-05-15: 1 via OPHTHALMIC

## 2015-05-15 MED ORDER — DEXAMETHASONE SODIUM PHOSPHATE 4 MG/ML IJ SOLN
INTRAMUSCULAR | Status: DC | PRN
  Administered 2015-05-15: 4 mg via INTRAVENOUS

## 2015-05-15 MED ORDER — LACTATED RINGERS IV SOLN
INTRAVENOUS | Status: DC | PRN
  Administered 2015-05-15: 07:00:00 via INTRAVENOUS

## 2015-05-15 MED ORDER — POTASSIUM CHLORIDE 10 MEQ/50ML IV SOLN: 10.0000 meq | Freq: Every day | INTRAVENOUS | Status: DC | PRN

## 2015-05-15 MED ORDER — FENTANYL 10 MCG/ML IV SOLN
INTRAVENOUS | Status: DC
  Administered 2015-05-15: 225 ug via INTRAVENOUS
  Administered 2015-05-15: 30 ug via INTRAVENOUS
  Administered 2015-05-15: 60 ug via INTRAVENOUS
  Administered 2015-05-15 – 2015-05-16 (×2): 210 ug via INTRAVENOUS
  Administered 2015-05-16: 195 ug via INTRAVENOUS
  Administered 2015-05-16: 163.6 ug via INTRAVENOUS
  Administered 2015-05-16: 180 ug via INTRAVENOUS
  Administered 2015-05-16: 150 ug via INTRAVENOUS
  Administered 2015-05-16 – 2015-05-17 (×2): 210 ug via INTRAVENOUS
  Administered 2015-05-17 (×2): via INTRAVENOUS
  Administered 2015-05-17: 270 ug via INTRAVENOUS
  Administered 2015-05-17: 160.5 ug via INTRAVENOUS
  Administered 2015-05-17: 22:00:00 via INTRAVENOUS
  Administered 2015-05-17: 125 ug via INTRAVENOUS
  Administered 2015-05-17: 120 ug via INTRAVENOUS
  Administered 2015-05-17: 195 ug via INTRAVENOUS
  Administered 2015-05-18: 240 ug via INTRAVENOUS
  Administered 2015-05-18: 10:00:00 via INTRAVENOUS
  Administered 2015-05-18: 177.4 ug via INTRAVENOUS
  Administered 2015-05-18: 162 ug via INTRAVENOUS
  Administered 2015-05-18: 135 ug via INTRAVENOUS
  Administered 2015-05-18: 20:00:00 via INTRAVENOUS
  Administered 2015-05-18: 180 ug via INTRAVENOUS
  Administered 2015-05-19: 150 ug via INTRAVENOUS
  Administered 2015-05-19: 237.9 ug via INTRAVENOUS
  Administered 2015-05-19: 75 ug via INTRAVENOUS
  Administered 2015-05-19: 165 ug via INTRAVENOUS
  Administered 2015-05-19: 20:00:00 via INTRAVENOUS
  Administered 2015-05-19: 195 ug via INTRAVENOUS
  Administered 2015-05-19: 131.5 ug via INTRAVENOUS
  Administered 2015-05-19: 10:00:00 via INTRAVENOUS
  Administered 2015-05-20: 135 ug via INTRAVENOUS
  Administered 2015-05-20: 150 ug via INTRAVENOUS
  Administered 2015-05-20: 120 ug via INTRAVENOUS
  Filled 2015-05-15 (×11): qty 50

## 2015-05-15 MED ORDER — FENTANYL CITRATE (PF) 100 MCG/2ML IJ SOLN
INTRAMUSCULAR | Status: DC | PRN
  Administered 2015-05-15: 100 ug via INTRAVENOUS
  Administered 2015-05-15: 50 ug via INTRAVENOUS
  Administered 2015-05-15 (×2): 100 ug via INTRAVENOUS
  Administered 2015-05-15 (×3): 50 ug via INTRAVENOUS

## 2015-05-15 MED ORDER — FENTANYL CITRATE (PF) 250 MCG/5ML IJ SOLN
INTRAMUSCULAR | Status: AC
  Filled 2015-05-15: qty 5

## 2015-05-15 MED ORDER — SENNOSIDES-DOCUSATE SODIUM 8.6-50 MG PO TABS
1.0000 | ORAL_TABLET | Freq: Every day | ORAL | Status: DC
  Administered 2015-05-15 – 2015-05-19 (×4): 1 via ORAL
  Filled 2015-05-15 (×6): qty 1

## 2015-05-15 MED ORDER — LEVALBUTEROL HCL 0.63 MG/3ML IN NEBU
0.6300 mg | INHALATION_SOLUTION | Freq: Four times a day (QID) | RESPIRATORY_TRACT | Status: DC
  Administered 2015-05-15 – 2015-05-17 (×7): 0.63 mg via RESPIRATORY_TRACT
  Filled 2015-05-15 (×15): qty 3

## 2015-05-15 MED ORDER — NALOXONE HCL 0.4 MG/ML IJ SOLN: 0.4000 mg | INTRAMUSCULAR | Status: DC | PRN

## 2015-05-15 MED ORDER — ONDANSETRON HCL 4 MG/2ML IJ SOLN
INTRAMUSCULAR | Status: DC | PRN
  Administered 2015-05-15 (×2): 4 mg via INTRAVENOUS

## 2015-05-15 MED ORDER — HYDROMORPHONE HCL 1 MG/ML IJ SOLN
INTRAMUSCULAR | Status: AC
Start: 2015-05-15 — End: 2015-05-15
  Administered 2015-05-15: 0.25 mg via INTRAVENOUS
  Filled 2015-05-15: qty 1

## 2015-05-15 MED ORDER — DIPHENHYDRAMINE HCL 12.5 MG/5ML PO ELIX
12.5000 mg | ORAL_SOLUTION | Freq: Four times a day (QID) | ORAL | Status: DC | PRN
  Filled 2015-05-15: qty 5

## 2015-05-15 MED ORDER — DEXTROSE 5 % IV SOLN
1.5000 g | Freq: Two times a day (BID) | INTRAVENOUS | Status: AC
  Administered 2015-05-15: 1.5 g via INTRAVENOUS
  Filled 2015-05-15 (×2): qty 1.5

## 2015-05-15 MED ORDER — DIPHENHYDRAMINE HCL 50 MG/ML IJ SOLN: 12.5000 mg | Freq: Four times a day (QID) | INTRAMUSCULAR | Status: DC | PRN

## 2015-05-15 MED ORDER — DEXTROSE-NACL 5-0.45 % IV SOLN
INTRAVENOUS | Status: DC
  Administered 2015-05-15: 13:00:00 via INTRAVENOUS

## 2015-05-15 MED ORDER — PROPOFOL 10 MG/ML IV BOLUS
INTRAVENOUS | Status: AC
  Filled 2015-05-15: qty 20

## 2015-05-15 MED ORDER — PROMETHAZINE HCL 25 MG/ML IJ SOLN
INTRAMUSCULAR | Status: AC
  Administered 2015-05-15: 6.25 mg
  Filled 2015-05-15: qty 1

## 2015-05-15 MED ORDER — HYDROMORPHONE HCL 1 MG/ML IJ SOLN
0.2500 mg | INTRAMUSCULAR | Status: DC | PRN
  Administered 2015-05-15: 0.25 mg via INTRAVENOUS
  Administered 2015-05-15: 0.5 mg via INTRAVENOUS

## 2015-05-15 MED ORDER — ONDANSETRON HCL 4 MG/2ML IJ SOLN
4.0000 mg | Freq: Four times a day (QID) | INTRAMUSCULAR | Status: DC | PRN
  Administered 2015-05-15 – 2015-05-20 (×7): 4 mg via INTRAVENOUS
  Filled 2015-05-15 (×7): qty 2

## 2015-05-15 MED ORDER — LIDOCAINE HCL (CARDIAC) 20 MG/ML IV SOLN
INTRAVENOUS | Status: DC | PRN
  Administered 2015-05-15: 20 mg via INTRAVENOUS
  Administered 2015-05-15: 50 mg via INTRAVENOUS
  Administered 2015-05-15: 80 mg via INTRAVENOUS

## 2015-05-15 MED ORDER — 0.9 % SODIUM CHLORIDE (POUR BTL) OPTIME
TOPICAL | Status: DC | PRN
  Administered 2015-05-15 (×2): 1000 mL

## 2015-05-15 MED ORDER — HYDROMORPHONE HCL 1 MG/ML IJ SOLN
INTRAMUSCULAR | Status: AC
  Administered 2015-05-15: 0.25 mg via INTRAVENOUS
  Filled 2015-05-15: qty 1

## 2015-05-15 MED ORDER — ONDANSETRON HCL 4 MG/2ML IJ SOLN: 4.0000 mg | Freq: Four times a day (QID) | INTRAMUSCULAR | Status: DC | PRN

## 2015-05-15 MED ORDER — PROMETHAZINE HCL 25 MG/ML IJ SOLN: 6.2500 mg | INTRAMUSCULAR | Status: DC | PRN

## 2015-05-15 MED ORDER — STERILE WATER FOR INJECTION IJ SOLN
INTRAMUSCULAR | Status: AC
  Filled 2015-05-15: qty 30

## 2015-05-15 MED ORDER — ROCURONIUM BROMIDE 50 MG/5ML IV SOLN
INTRAVENOUS | Status: AC
  Filled 2015-05-15: qty 1

## 2015-05-15 MED ORDER — BISACODYL 5 MG PO TBEC
10.0000 mg | DELAYED_RELEASE_TABLET | Freq: Every day | ORAL | Status: DC
  Administered 2015-05-16: 10 mg via ORAL
  Filled 2015-05-15 (×2): qty 2

## 2015-05-15 MED ORDER — GLYCOPYRROLATE 0.2 MG/ML IJ SOLN
INTRAMUSCULAR | Status: DC | PRN
  Administered 2015-05-15: 0.6 mg via INTRAVENOUS

## 2015-05-15 MED ORDER — DEXTROSE 5 % IV SOLN
1.5000 g | Freq: Two times a day (BID) | INTRAVENOUS | Status: DC
  Filled 2015-05-15: qty 1.5

## 2015-05-15 MED ORDER — SODIUM CHLORIDE 0.9 % IJ SOLN: 9.0000 mL | INTRAMUSCULAR | Status: DC | PRN

## 2015-05-15 MED ORDER — LIDOCAINE HCL 4 % MT SOLN
OROMUCOSAL | Status: DC | PRN
  Administered 2015-05-15: 2 mL via TOPICAL

## 2015-05-15 MED ORDER — NEOSTIGMINE METHYLSULFATE 10 MG/10ML IV SOLN
INTRAVENOUS | Status: DC | PRN
  Administered 2015-05-15: 5 mg via INTRAVENOUS

## 2015-05-15 MED ORDER — ACETAMINOPHEN 500 MG PO TABS
1000.0000 mg | ORAL_TABLET | Freq: Four times a day (QID) | ORAL | Status: AC
  Administered 2015-05-15 – 2015-05-20 (×18): 1000 mg via ORAL
  Filled 2015-05-15 (×20): qty 2

## 2015-05-15 MED ORDER — PROPOFOL 10 MG/ML IV BOLUS
INTRAVENOUS | Status: DC | PRN
  Administered 2015-05-15: 170 mg via INTRAVENOUS
  Administered 2015-05-15: 30 mg via INTRAVENOUS
  Administered 2015-05-15: 50 mg via INTRAVENOUS

## 2015-05-15 MED ORDER — ACETAMINOPHEN 160 MG/5ML PO SOLN
1000.0000 mg | Freq: Four times a day (QID) | ORAL | Status: AC
  Administered 2015-05-16: 1000 mg via ORAL
  Filled 2015-05-15 (×20): qty 40

## 2015-05-15 MED ORDER — HYDROMORPHONE HCL 1 MG/ML IJ SOLN
0.2500 mg | INTRAMUSCULAR | Status: DC | PRN
  Administered 2015-05-15 (×4): 0.25 mg via INTRAVENOUS

## 2015-05-15 MED ORDER — LIDOCAINE HCL (CARDIAC) 20 MG/ML IV SOLN
INTRAVENOUS | Status: AC
  Filled 2015-05-15: qty 20

## 2015-05-15 MED ORDER — VECURONIUM BROMIDE 10 MG IV SOLR
INTRAVENOUS | Status: AC
  Filled 2015-05-15: qty 30

## 2015-05-15 MED ORDER — MIDAZOLAM HCL 2 MG/2ML IJ SOLN
INTRAMUSCULAR | Status: AC
  Filled 2015-05-15: qty 2

## 2015-05-15 MED ORDER — INSULIN ASPART 100 UNIT/ML ~~LOC~~ SOLN
0.0000 [IU] | SUBCUTANEOUS | Status: DC
  Administered 2015-05-15 – 2015-05-16 (×3): 2 [IU] via SUBCUTANEOUS

## 2015-05-15 MED ORDER — ROCURONIUM BROMIDE 100 MG/10ML IV SOLN
INTRAVENOUS | Status: DC | PRN
  Administered 2015-05-15: 40 mg via INTRAVENOUS
  Administered 2015-05-15: 10 mg via INTRAVENOUS

## 2015-05-15 SURGICAL SUPPLY — 63 items
CANISTER SUCTION 2500CC (MISCELLANEOUS) ×4 IMPLANT
CATH KIT ON Q 5IN SLV (PAIN MANAGEMENT) IMPLANT
CATH THORACIC 28FR (CATHETERS) IMPLANT
CATH THORACIC 36FR (CATHETERS) IMPLANT
CATH THORACIC 36FR RT ANG (CATHETERS) IMPLANT
CLEANER TIP ELECTROSURG 2X2 (MISCELLANEOUS) ×2 IMPLANT
CONT SPEC 4OZ CLIKSEAL STRL BL (MISCELLANEOUS) ×6 IMPLANT
COVER SURGICAL LIGHT HANDLE (MISCELLANEOUS) ×4 IMPLANT
DERMABOND ADVANCED (GAUZE/BANDAGES/DRESSINGS) ×1
DERMABOND ADVANCED .7 DNX12 (GAUZE/BANDAGES/DRESSINGS) ×1 IMPLANT
DRAPE CAMERA VIDEO/LASER (DRAPES) IMPLANT
DRAPE LAPAROSCOPIC ABDOMINAL (DRAPES) ×2 IMPLANT
DRAPE SLUSH/WARMER DISC (DRAPES) ×2 IMPLANT
DRAPE WARM FLUID 44X44 (DRAPE) IMPLANT
ELECT BLADE 6.5 EXT (BLADE) ×2 IMPLANT
ELECT REM PT RETURN 9FT ADLT (ELECTROSURGICAL) ×2
ELECTRODE REM PT RTRN 9FT ADLT (ELECTROSURGICAL) ×1 IMPLANT
GAUZE SPONGE 4X4 12PLY STRL (GAUZE/BANDAGES/DRESSINGS) ×2 IMPLANT
GLOVE EUDERMIC 7 POWDERFREE (GLOVE) ×4 IMPLANT
GOWN STRL REUS W/ TWL LRG LVL3 (GOWN DISPOSABLE) ×3 IMPLANT
GOWN STRL REUS W/ TWL XL LVL3 (GOWN DISPOSABLE) ×1 IMPLANT
GOWN STRL REUS W/TWL LRG LVL3 (GOWN DISPOSABLE) ×3
GOWN STRL REUS W/TWL XL LVL3 (GOWN DISPOSABLE) ×1
HANDLE STAPLE ENDO GIA SHORT (STAPLE) ×1
KIT BASIN OR (CUSTOM PROCEDURE TRAY) ×2 IMPLANT
KIT ROOM TURNOVER OR (KITS) ×2 IMPLANT
KIT SUCTION CATH 14FR (SUCTIONS) ×2 IMPLANT
NS IRRIG 1000ML POUR BTL (IV SOLUTION) ×4 IMPLANT
PACK CHEST (CUSTOM PROCEDURE TRAY) ×2 IMPLANT
PAD ARMBOARD 7.5X6 YLW CONV (MISCELLANEOUS) ×4 IMPLANT
RELOAD EGIA 45 MED/THCK PURPLE (STAPLE) ×4 IMPLANT
RELOAD EGIA 60 MED/THCK PURPLE (STAPLE) ×6 IMPLANT
SEALANT SURG COSEAL 4ML (VASCULAR PRODUCTS) IMPLANT
SEALANT SURG COSEAL 8ML (VASCULAR PRODUCTS) IMPLANT
SOLUTION ANTI FOG 6CC (MISCELLANEOUS) IMPLANT
SPECIMEN JAR MEDIUM (MISCELLANEOUS) ×2 IMPLANT
STAPLER ENDO GIA 12MM SHORT (STAPLE) ×1 IMPLANT
SUT PROLENE 3 0 SH DA (SUTURE) IMPLANT
SUT PROLENE 4 0 RB 1 (SUTURE)
SUT PROLENE 4-0 RB1 .5 CRCL 36 (SUTURE) IMPLANT
SUT SILK  1 MH (SUTURE) ×2
SUT SILK 1 MH (SUTURE) ×2 IMPLANT
SUT SILK 1 TIES 10X30 (SUTURE) ×2 IMPLANT
SUT SILK 2 0SH CR/8 30 (SUTURE) IMPLANT
SUT VIC AB 1 CTX 36 (SUTURE) ×2
SUT VIC AB 1 CTX36XBRD ANBCTR (SUTURE) ×2 IMPLANT
SUT VIC AB 2 TP1 27 (SUTURE) IMPLANT
SUT VIC AB 2-0 CT1 27 (SUTURE)
SUT VIC AB 2-0 CT1 TAPERPNT 27 (SUTURE) IMPLANT
SUT VIC AB 2-0 CTX 36 (SUTURE) IMPLANT
SUT VIC AB 2-0 UR6 27 (SUTURE) ×4 IMPLANT
SUT VIC AB 3-0 MH 27 (SUTURE) IMPLANT
SUT VIC AB 3-0 SH 27 (SUTURE)
SUT VIC AB 3-0 SH 27X BRD (SUTURE) IMPLANT
SUT VIC AB 3-0 X1 27 (SUTURE) ×4 IMPLANT
SYSTEM SAHARA CHEST DRAIN ATS (WOUND CARE) ×2 IMPLANT
TAPE CLOTH SURG 4X10 WHT LF (GAUZE/BANDAGES/DRESSINGS) ×2 IMPLANT
TIP APPLICATOR SPRAY EXTEND 16 (VASCULAR PRODUCTS) IMPLANT
TOWEL OR 17X24 6PK STRL BLUE (TOWEL DISPOSABLE) ×2 IMPLANT
TOWEL OR 17X26 10 PK STRL BLUE (TOWEL DISPOSABLE) ×2 IMPLANT
TRAP SPECIMEN MUCOUS 40CC (MISCELLANEOUS) IMPLANT
TRAY FOLEY CATH 14FRSI W/METER (CATHETERS) ×2 IMPLANT
WATER STERILE IRR 1000ML POUR (IV SOLUTION) ×4 IMPLANT

## 2015-05-15 NOTE — Anesthesia Preprocedure Evaluation (Addendum)
Anesthesia Evaluation  Patient identified by MRN, date of birth, ID band Patient awake  General Assessment Comment:Patient is 17 weeks preganant  Reviewed: Allergy & Precautions, NPO status , Patient's Chart, lab work & pertinent test results  Airway Mallampati: I       Dental  (+) Teeth Intact   Pulmonary Current Smoker,  breath sounds clear to auscultation        Cardiovascular negative cardio ROS  Rhythm:Regular Rate:Normal     Neuro/Psych    GI/Hepatic negative GI ROS, Neg liver ROS,   Endo/Other  negative endocrine ROS  Renal/GU negative Renal ROS     Musculoskeletal   Abdominal   Peds  Hematology negative hematology ROS (+)   Anesthesia Other Findings   Reproductive/Obstetrics                            Anesthesia Physical Anesthesia Plan  ASA: II  Anesthesia Plan: General   Post-op Pain Management:    Induction:   Airway Management Planned: Double Lumen EBT  Additional Equipment: Arterial line  Intra-op Plan:   Post-operative Plan: Extubation in OR  Informed Consent: I have reviewed the patients History and Physical, chart, labs and discussed the procedure including the risks, benefits and alternatives for the proposed anesthesia with the patient or authorized representative who has indicated his/her understanding and acceptance.   Dental advisory given  Plan Discussed with: CRNA and Surgeon  Anesthesia Plan Comments:         Anesthesia Quick Evaluation

## 2015-05-15 NOTE — Addendum Note (Signed)
Addendum  created 05/15/15 1159 by Elisabeth Mostoger A Sabino Denning, CRNA   Modules edited: Anesthesia Blocks and Procedures, Clinical Notes   Clinical Notes:  File: [id number]

## 2015-05-15 NOTE — Anesthesia Procedure Notes (Addendum)
Procedure Name: Intubation Date/Time: 05/15/2015 7:50 AM Performed by: Jacquiline Doe A Pre-anesthesia Checklist: Patient identified, Timeout performed, Emergency Drugs available, Suction available and Patient being monitored Patient Re-evaluated:Patient Re-evaluated prior to inductionOxygen Delivery Method: Circle system utilized Preoxygenation: Pre-oxygenation with 100% oxygen Intubation Type: IV induction and Cricoid Pressure applied Ventilation: Mask ventilation without difficulty Laryngoscope Size: Mac and 3 Grade View: Grade I Tube type: Oral Endobronchial tube: Left, Double lumen EBT, EBT position confirmed by auscultation and EBT position confirmed by fiberoptic bronchoscope and 35 Fr Number of attempts: 1 Airway Equipment and Method: Stylet,  Fiberoptic brochoscope and LTA kit utilized Placement Confirmation: ETT inserted through vocal cords under direct vision,  breath sounds checked- equal and bilateral and positive ETCO2 Secured at: 27 cm Tube secured with: Tape Dental Injury: Teeth and Oropharynx as per pre-operative assessment

## 2015-05-15 NOTE — Addendum Note (Signed)
Addendum  created 05/15/15 1143 by Elisabeth Mostoger A Bijon Mineer, CRNA   Modules edited: Anesthesia Events, Anesthesia Medication Administration, Narrator, Narrator Events   Narrator:  Narrator: Event Log Edited   Narrator Events:  Delete Quick Note event; Delete Quick Note event

## 2015-05-15 NOTE — Op Note (Addendum)
CARDIOTHORACIC SURGERY OPERATIVE NOTE:  [name] [name] [id number] 05/15/2015   Preoperative Dx:  Spontaneous right pneumothorax  Postoperative Dx: Same   Procedure: Right video-assisted thoracoscopy with stapling of apical blebs  Surgeon: Dr. Alleen BorneBryan K. Corderro Koloski   Assistant: Doree Fudgeonielle Zimmerman, PA-C  Anesthesia: GET   Clinical History:   The patient is a 29 year old smoker who is [redacted] weeks pregnant and presented with a first episode of spontaneous pneumothorax with complete collapse of the lung and some tension. She has a chest tube inserted in the ER and has had a persistent air leak for 4 days. Therefore I felt it was best to proceed with VATS for bleb stapling. I discussed surgery with her OB who felt that it was the best time to proceed with a surgical procedure. I discussed this with her and the option of continued chest tube drainage. I discussed the risks of surgery including but not limited to bleeding, infection, recurrent ptx and she understands and would like to proceed with surgery.  Operative procedure:   The patient was seen in the preoperative holding area. The proper patient, proper operative side, proper operation were confirmed after reviewing his history and chest x-ray. The rightt side of the chest was signed by me. Preoperative intravenous antibiotics were given. She was taken back to the operating room and placed on table in the supine position. After induction of general endotracheal anesthesia using a double-lumen tube, a Foley catheter was placed in the bladder using sterile technique. Lower extremity sequential compression devices were used. The patient was positioned in the left lateral decubitus position with the right side up. The previously placed chest tube was removed. The rightt side of the chest was prepped with Betadine soap and solution and draped in the usual sterile manner. A timeout was taken and the proper patient, proper operation, and proper operative  side were confirmed with nursing and anesthesia staff. A 1 cm incision was made in the midaxillary line at about the eighth intercostal space. The right pleural space was entered bluntly with a hemostat and an 8 mm trocar was inserted. The 30 thoracoscope was inserted and the pleural space inspected. A second 1 cm incision was made in the anterior axillary line at the 4th ICS and a third 1 cm incision in the posterior axillary line at the 5th ICS. The apex was examined and there were blebs present with some small air bubbles suggesting that this was the site of her leak. The apical blebs were resected using staplers and the wedge specimens x 2 were sent to pathology. The superior segment of the lower lobe had no blebs. The remainder of the lung was carefully examined and no further blebs were seen. Therefore a complete mechanical pleurodesis was performed using a piece of an abrasive pad placed in a long clamp. Then one 28 French chest tube was placed. The lung was reinflated under direct vision and I did not see any significant air leak. Hemostasis appeared adequate. The anterior and posterior axillary incisions were then closed in layers using a 2-0 Vicryl subcutaneous suture and 3-0 Vicryl subcuticular skin closure. The chest tubes were connected to Pleur-evac suction. The sponge needle and instrument counts were correct according to the scrub nurse. The patient was then turned into the supine position, extubated, and transported to the post anesthesia care unit in satisfactory and stable condition.

## 2015-05-15 NOTE — Progress Notes (Signed)
Nutrition Follow-up  DOCUMENTATION CODES:  Not applicable  INTERVENTION:  Boost Breeze  NUTRITION DIAGNOSIS:  Inadequate oral intake related to altered GI function as evidenced by per patient/family report.  Ongoing.  GOAL:  Patient will meet greater than or equal to 90% of their needs  Progressing.  MONITOR:  PO intake, Supplement acceptance, Weight trends, Labs  REASON FOR ASSESSMENT:  Malnutrition Screening Tool    ASSESSMENT: Patient admitted with SOB and chest pain related to right sided pneumothorax. S/P chest tube placement. She is [redacted] weeks pregnant.   Hx of hyperemesis, nausea controlled by Zofran. PO intake of meals has improved, patient is consuming 100% of meals. Also receiving Resource Breeze TID between meals.  S/P VATS with stapling of apical blebs today. Currently NPO.   Height:  Ht Readings from Last 1 Encounters:  05/10/15 5\' 3"  (1.6 m)    Weight:  Wt Readings from Last 1 Encounters:  05/10/15 131 lb 13.4 oz (59.8 kg)    Wt Readings from Last 10 Encounters:  05/10/15 131 lb 13.4 oz (59.8 kg)  03/14/15 128 lb 12.8 oz (58.423 kg)  03/13/15 129 lb (58.514 kg)    BMI:  Body mass index is 23.36 kg/(m^2).  Estimated Nutritional Needs:  Kcal:  1800-2000  Protein:  75-90 gm  Fluid:  2 L  Skin:  Reviewed, no issues  Diet Order:  Diet NPO time specified  EDUCATION NEEDS:  Education needs addressed   Intake/Output Summary (Last 24 hours) at 05/15/15 1506 Last data filed at 05/15/15 1300  Gross per 24 hour  Intake 1900.5 ml  Output   1990 ml  Net  -89.5 ml    Last BM:  Unknown   Joaquin CourtsKimberly Caci Orren, RD, LDN, CNSC Pager [phone] After Hours Pager [phone]

## 2015-05-15 NOTE — Anesthesia Postprocedure Evaluation (Signed)
  Anesthesia Post-op Note  Patient: [name] [name]  Procedure(s) Performed: Procedure(s): VIDEO ASSISTED THORACOSCOPY (Right) STAPLING OF BLEBS (Right)  Patient Location: PACU  Anesthesia Type:General  Level of Consciousness: awake and alert   Airway and Oxygen Therapy: Patient Spontanous Breathing  Post-op Pain: mild  Post-op Assessment: Post-op Vital signs reviewed  Post-op Vital Signs: stable  Last Vitals:  Filed Vitals:   05/15/15 1100  BP:   Pulse: 67  Temp:   Resp: 11    Complications: No apparent anesthesia complications

## 2015-05-15 NOTE — OR Nursing (Signed)
Dr. Laneta SimmersBartle to bedside.  Stated PCXR ok and pt can return to prior bed in 3S.

## 2015-05-15 NOTE — Addendum Note (Signed)
Addendum  created 05/15/15 1211 by Elisabeth Mostoger A Eitan Doubleday, CRNA   Modules edited: Anesthesia Flowsheet

## 2015-05-15 NOTE — Brief Op Note (Signed)
05/10/2015 - 05/15/2015  9:33 AM  PATIENT:  [name] [name]  29 y.o. female  PRE-OPERATIVE DIAGNOSIS: Spontaneous right pneumothorax  POST-OPERATIVE DIAGNOSIS:  Same  PROCEDURE:  Procedure(s): VIDEO ASSISTED THORACOSCOPY (Right) STAPLING OF BLEBS (Right)  SURGEON:  Surgeon(s) and Role:    * Alleen BorneBryan K Coti Burd, MD - Primary  PHYSICIAN ASSISTANT:  Doree Fudgeonielle Zimmerman, PA-C    ANESTHESIA:   general  EBL:  Total I/O In: 1000 [I.V.:1000] Out: 450 [Urine:400; Blood:50]  BLOOD ADMINISTERED:none  DRAINS: 1 28F Chest Tube(s) in the right pleural space   LOCAL MEDICATIONS USED:  NONE  SPECIMEN:  Source of Specimen:  wedge right apical bleb x 2  DISPOSITION OF SPECIMEN:  PATHOLOGY  COUNTS:  YES  TOURNIQUET:  * No tourniquets in log *  DICTATION: .Note written in EPIC  PLAN OF CARE: Admit to inpatient   PATIENT DISPOSITION:  PACU - hemodynamically stable.   Delay start of Pharmacological VTE agent (>24hrs) due to surgical blood loss or risk of bleeding: yes

## 2015-05-15 NOTE — Transfer of Care (Signed)
Immediate Anesthesia Transfer of Care Note  Patient: [name] [name]  Procedure(s) Performed: Procedure(s): VIDEO ASSISTED THORACOSCOPY (Right) STAPLING OF BLEBS (Right)  Patient Location: PACU  Anesthesia Type:General  Level of Consciousness: oriented, sedated, patient cooperative and responds to stimulation  Airway & Oxygen Therapy: Patient Spontanous Breathing and Patient connected to nasal cannula oxygen  Post-op Assessment: Report given to RN, Post -op Vital signs reviewed and stable, Patient moving all extremities and Patient moving all extremities X 4  Post vital signs: Reviewed and stable  Last Vitals:  Filed Vitals:   05/15/15 0324  BP: 100/52  Pulse: 71  Temp: 36.7 C  Resp: 16    Complications: No apparent anesthesia complications

## 2015-05-16 ENCOUNTER — Inpatient Hospital Stay (HOSPITAL_COMMUNITY): Payer: 59

## 2015-05-16 ENCOUNTER — Encounter (HOSPITAL_COMMUNITY): Payer: Self-pay | Admitting: Surgery

## 2015-05-16 LAB — BLOOD GAS, ARTERIAL
Acid-Base Excess: 0.2 mmol/L (ref 0.0–2.0)
Bicarbonate: 23.9 mEq/L (ref 20.0–24.0)
DRAWN BY: 369891
FIO2: 0.32 %
O2 Saturation: 99 %
PCO2 ART: 35.3 mmHg (ref 35.0–45.0)
PO2 ART: 119 mmHg — AB (ref 80.0–100.0)
Patient temperature: 98.6
TCO2: 24.9 mmol/L (ref 0–100)
pH, Arterial: 7.445 (ref 7.350–7.450)

## 2015-05-16 LAB — GLUCOSE, CAPILLARY
GLUCOSE-CAPILLARY: 92 mg/dL (ref 65–99)
GLUCOSE-CAPILLARY: 109 mg/dL — AB (ref 65–99)
GLUCOSE-CAPILLARY: 121 mg/dL — AB (ref 65–99)
Glucose-Capillary: 127 mg/dL — ABNORMAL HIGH (ref 65–99)
Glucose-Capillary: 158 mg/dL — ABNORMAL HIGH (ref 65–99)
Glucose-Capillary: 130 mg/dL — ABNORMAL HIGH (ref 65–99)

## 2015-05-16 LAB — BASIC METABOLIC PANEL
Anion gap: 6 (ref 5–15)
CO2: 25 mmol/L (ref 22–32)
CREATININE: 0.35 mg/dL — AB (ref 0.44–1.00)
Calcium: 8.5 mg/dL — ABNORMAL LOW (ref 8.9–10.3)
Chloride: 104 mmol/L (ref 101–111)
GFR calc Af Amer: >60 mL/min (ref >60)
Glucose, Bld: 105 mg/dL — ABNORMAL HIGH (ref 65–99)
Potassium: 3.1 mmol/L — ABNORMAL LOW (ref 3.5–5.1)
Sodium: 135 mmol/L (ref 135–145)

## 2015-05-16 LAB — CBC
HCT: 33 % — ABNORMAL LOW (ref 36.0–46.0)
Hemoglobin: 11.5 g/dL — ABNORMAL LOW (ref 12.0–15.0)
MCH: 30.3 pg (ref 26.0–34.0)
MCHC: 34.8 g/dL (ref 30.0–36.0)
MCV: 86.8 fL (ref 78.0–100.0)
PLATELETS: 212 10*3/uL (ref 150–400)
RBC: 3.8 MIL/uL — ABNORMAL LOW (ref 3.87–5.11)
RDW: 14.2 % (ref 11.5–15.5)
WBC: 10.8 10*3/uL — ABNORMAL HIGH (ref 4.0–10.5)

## 2015-05-16 MED ORDER — POTASSIUM CHLORIDE CRYS ER 20 MEQ PO TBCR
40.0000 meq | EXTENDED_RELEASE_TABLET | Freq: Once | ORAL | Status: AC
  Administered 2015-05-16: 40 meq via ORAL
  Filled 2015-05-16: qty 2

## 2015-05-16 NOTE — Progress Notes (Signed)
1 Day Post-Op Procedure(s) (LRB): VIDEO ASSISTED THORACOSCOPY (Right) STAPLING OF BLEBS (Right) Subjective: Pain under control  Objective: Vital signs in last 24 hours: Temp:  [98.2 F (36.8 C)-99 F (37.2 C)] 99 F (37.2 C) (05/18 1515) Pulse Rate:  [71-114] 114 (05/18 1529) Cardiac Rhythm:  [-] Normal sinus rhythm (05/18 1529) Resp:  [17-21] 19 (05/18 1529) BP: (97-136)/(42-71) 108/48 mmHg (05/18 1529) SpO2:  [95 %-100 %] 98 % (05/18 1529)  Hemodynamic parameters for last 24 hours:    Intake/Output from previous day: 05/17 0701 - 05/18 0700 In: 2443.5 [I.V.:2393.5; IV Piggyback:50] Out: 2542 [Urine:2300; Blood:50; Chest Tube:192] Intake/Output this shift: Total I/O In: 2050 [P.O.:940; I.V.:1110] Out: 285 [Urine:225; Chest Tube:60]  General appearance: alert and cooperative Heart: regular rate and rhythm, S1, S2 normal, no murmur, click, rub or gallop Lungs: clear to auscultation bilaterally no air leak from chest tube  Lab Results:  Recent Labs  05/16/15 0449  WBC 10.8*  HGB 11.5*  HCT 33.0*  PLT 212   BMET:  Recent Labs  05/16/15 0449  NA 135  K 3.1*  CL 104  CO2 25  GLUCOSE 105*  BUN <5*  CREATININE 0.35*  CALCIUM 8.5*    PT/INR: No results for input(s): LABPROT, INR in the last 72 hours. ABG    Component Value Date/Time   PHART 7.445 05/16/2015 0345   HCO3 23.9 05/16/2015 0345   TCO2 24.9 05/16/2015 0345   O2SAT 99.0 05/16/2015 0345   CBG (last 3)   Recent Labs  05/16/15 0719 05/16/15 1118 05/16/15 1529  GLUCAP 127* 158* 130*    CLINICAL DATA: Right side chest tube, pneumothorax  EXAM: PORTABLE CHEST - 1 VIEW  COMPARISON: 05/15/2015  FINDINGS: Cardiomediastinal silhouette is stable. Stable right chest tube position. Again noted postsurgical changes in right upper lobe. Stable small right apical pneumothorax. Mild bilateral basilar atelectasis.  IMPRESSION: Stable right chest tube position and small right  apical pneumothorax. Again noted postsurgical changes in right upper lobe. Mild basilar atelectasis. No segmental infiltrate.   Electronically Signed  By: Natasha MeadLiviu Pop M.D.  On: 05/16/2015 09:40    Assessment/Plan: S/P Procedure(s) (LRB): VIDEO ASSISTED THORACOSCOPY (Right) STAPLING OF BLEBS (Right)  Continue chest tube to suction. DC foley and A-line mobilize   LOS: 6 days    Alleen BorneBryan K Jalen Daluz 05/16/2015

## 2015-05-16 NOTE — Progress Notes (Signed)
Medicare Important Message given? YES (If response is "NO", the following Medicare IM given date fields will be blank) Date Medicare IM given:05/16/15 Medicare IM given by: Alvon Nygaard 

## 2015-05-17 ENCOUNTER — Inpatient Hospital Stay (HOSPITAL_COMMUNITY): Payer: 59

## 2015-05-17 MED ORDER — LEVALBUTEROL HCL 0.63 MG/3ML IN NEBU
0.6300 mg | INHALATION_SOLUTION | Freq: Two times a day (BID) | RESPIRATORY_TRACT | Status: DC
  Administered 2015-05-17 – 2015-05-19 (×5): 0.63 mg via RESPIRATORY_TRACT
  Filled 2015-05-17 (×11): qty 3

## 2015-05-17 MED ORDER — CALCIUM CARBONATE ANTACID 500 MG PO CHEW
2.0000 | CHEWABLE_TABLET | Freq: Three times a day (TID) | ORAL | Status: DC | PRN
  Administered 2015-05-17 – 2015-05-18 (×5): 400 mg via ORAL
  Filled 2015-05-17 (×8): qty 2

## 2015-05-17 NOTE — Progress Notes (Signed)
UR COMPLETED  

## 2015-05-17 NOTE — Progress Notes (Signed)
2 Days Post-Op Procedure(s) (LRB): VIDEO ASSISTED THORACOSCOPY (Right) STAPLING OF BLEBS (Right) Subjective:  No complaints  Objective: Vital signs in last 24 hours: Temp:  [97.8 F (36.6 C)-99 F (37.2 C)] 97.8 F (36.6 C) (05/19 0312) Pulse Rate:  [69-114] 69 (05/19 0312) Cardiac Rhythm:  [-] Normal sinus rhythm (05/18 2245) Resp:  [13-23] 13 (05/19 0312) BP: (96-122)/(45-67) 96/45 mmHg (05/19 0312) SpO2:  [95 %-99 %] 98 % (05/19 0312)  Hemodynamic parameters for last 24 hours:    Intake/Output from previous day: 05/18 0701 - 05/19 0700 In: 3551.7 [P.O.:1780; I.V.:1771.7] Out: 1395 [Urine:1325; Chest Tube:70] Intake/Output this shift: Total I/O In: 100 [I.V.:100] Out: 50 [Chest Tube:50]  General appearance: alert and cooperative Heart: regular rate and rhythm, S1, S2 normal, no murmur, click, rub or gallop Lungs: clear to auscultation bilaterally occasional bubble coming through chamber.  Lab Results:  Recent Labs  05/16/15 0449  WBC 10.8*  HGB 11.5*  HCT 33.0*  PLT 212   BMET:  Recent Labs  05/16/15 0449  NA 135  K 3.1*  CL 104  CO2 25  GLUCOSE 105*  BUN <5*  CREATININE 0.35*  CALCIUM 8.5*    PT/INR: No results for input(s): LABPROT, INR in the last 72 hours. ABG    Component Value Date/Time   PHART 7.445 05/16/2015 0345   HCO3 23.9 05/16/2015 0345   TCO2 24.9 05/16/2015 0345   O2SAT 99.0 05/16/2015 0345   CBG (last 3)   Recent Labs  05/16/15 1529 05/16/15 1913 05/16/15 2316  GLUCAP 130* 109* 121*   CLINICAL DATA: Pneumothorax with evidence of chest tube placement  EXAM: PORTABLE CHEST - 1 VIEW  COMPARISON: 05/16/2015  FINDINGS: Postsurgical changes are again noted in the right apex. A small apical pneumothorax is again identified which is stable in appearance from the prior exam. The right chest tube is again identified and stable. The left lung remains clear. Cardiac shadow is unremarkable.  IMPRESSION: Small  residual right apical pneumothorax stable in appearance from the prior exam.   Electronically Signed  By: Alcide CleverMark Lukens M.D.  On: 05/17/2015 08:02 Assessment/Plan: S/P Procedure(s) (LRB): VIDEO ASSISTED THORACOSCOPY (Right) STAPLING OF BLEBS (Right)  There is still an occasional bubble coming through pleurevac and a small apical ptx so I think tube should stay on suction today at 10 cm.    LOS: 7 days    Alleen BorneBryan K Jenner Rosier 05/17/2015

## 2015-05-18 ENCOUNTER — Inpatient Hospital Stay (HOSPITAL_COMMUNITY): Payer: 59

## 2015-05-18 NOTE — Progress Notes (Signed)
3 Days Post-Op Procedure(s) (LRB): VIDEO ASSISTED THORACOSCOPY (Right) STAPLING OF BLEBS (Right) Subjective:  Uncomfortable from tube.   Objective: Vital signs in last 24 hours: Temp:  [98.1 F (36.7 C)-98.6 F (37 C)] 98.1 F (36.7 C) (05/20 0400) Pulse Rate:  [70-88] 78 (05/20 0343) Cardiac Rhythm:  [-] Normal sinus rhythm (05/20 0343) Resp:  [12-23] 23 (05/20 0452) BP: (95-120)/(49-72) 103/52 mmHg (05/20 0343) SpO2:  [96 %-100 %] 100 % (05/20 0823)  Hemodynamic parameters for last 24 hours:    Intake/Output from previous day: 05/19 0701 - 05/20 0700 In: 1764 [P.O.:1440; I.V.:324] Out: 1910 [Urine:1850; Chest Tube:60] Intake/Output this shift:    General appearance: alert and cooperative Heart: regular rate and rhythm, S1, S2 normal, no murmur, click, rub or gallop Lungs: clear to auscultation bilaterally no air leak with multiple coughs  Lab Results:  Recent Labs  05/16/15 0449  WBC 10.8*  HGB 11.5*  HCT 33.0*  PLT 212   BMET:  Recent Labs  05/16/15 0449  NA 135  K 3.1*  CL 104  CO2 25  GLUCOSE 105*  BUN <5*  CREATININE 0.35*  CALCIUM 8.5*    PT/INR: No results for input(s): LABPROT, INR in the last 72 hours. ABG    Component Value Date/Time   PHART 7.445 05/16/2015 0345   HCO3 23.9 05/16/2015 0345   TCO2 24.9 05/16/2015 0345   O2SAT 99.0 05/16/2015 0345   CBG (last 3)   Recent Labs  05/16/15 1529 05/16/15 1913 05/16/15 2316  GLUCAP 130* 109* 121*    Assessment/Plan: S/P Procedure(s) (LRB): VIDEO ASSISTED THORACOSCOPY (Right) STAPLING OF BLEBS (Right)  No air leak seen today so will put to water seal. If there is no air leak tomorrow and CXR stable would remove tube and repeat CXR afterwards for a baseline. If her CXR is stable Sunday am she can go home.   LOS: 8 days    Alleen BorneBryan K Bartle 05/18/2015

## 2015-05-18 NOTE — Progress Notes (Signed)
Fentanyl PCA replaced. Wasted 1mL in sink. Witness by Delories HeinzKeara RN.

## 2015-05-19 ENCOUNTER — Inpatient Hospital Stay (HOSPITAL_COMMUNITY): Payer: 59

## 2015-05-19 NOTE — Progress Notes (Signed)
Fentanyl PCA replaced. Wasted 1mL in sink. Witness by Delories HeinzKeara RN.

## 2015-05-19 NOTE — Progress Notes (Addendum)
[address]       [place] [address]             [phone]      4 Days Post-Op Procedure(s) (LRB): VIDEO ASSISTED THORACOSCOPY (Right) STAPLING OF BLEBS (Right) Subjective: Feels fine, not SOB  Objective: Vital signs in last 24 hours: Temp:  [98 F (36.7 C)-98.6 F (37 C)] 98 F (36.7 C) (05/21 0721) Pulse Rate:  [80-98] 91 (05/21 0721) Cardiac Rhythm:  [-] Sinus tachycardia (05/21 0811) Resp:  [13-20] 13 (05/21 0808) BP: (107-131)/(65-83) 107/66 mmHg (05/21 0721) SpO2:  [98 %-100 %] 99 % (05/21 0931)  Hemodynamic parameters for last 24 hours:    Intake/Output from previous day: 05/20 0701 - 05/21 0700 In: 240 [P.O.:240] Out: 1500 [Urine:1475; Chest Tube:25] Intake/Output this shift:    General appearance: alert, cooperative and no distress Heart: regular rate and rhythm Lungs: clear to auscultation bilaterally Wound: healing well  Lab Results: No results for input(s): WBC, HGB, HCT, PLT in the last 72 hours. BMET: No results for input(s): NA, K, CL, CO2, GLUCOSE, BUN, CREATININE, CALCIUM in the last 72 hours.  PT/INR: No results for input(s): LABPROT, INR in the last 72 hours. ABG    Component Value Date/Time   PHART 7.445 05/16/2015 0345   HCO3 23.9 05/16/2015 0345   TCO2 24.9 05/16/2015 0345   O2SAT 99.0 05/16/2015 0345   CBG (last 3)   Recent Labs  05/16/15 1529 05/16/15 1913 05/16/15 2316  GLUCAP 130* 109* 121*    Meds Scheduled Meds: . acetaminophen  1,000 mg Oral 4 times per day   Or  . acetaminophen (TYLENOL) oral liquid 160 mg/5 mL  1,000 mg Oral 4 times per day  . bisacodyl  10 mg Oral Daily  . feeding supplement (RESOURCE BREEZE)  1 Container Oral TID BM  . fentaNYL   Intravenous 6 times per day  . levalbuterol  0.63 mg Nebulization BID  . prenatal multivitamin  1 tablet Oral QHS  . senna-docusate  1 tablet Oral QHS   Continuous Infusions: . dextrose 5 % and 0.45% NaCl 10 mL/hr at 05/17/15 0806   PRN  Meds:.calcium carbonate, diphenhydrAMINE **OR** diphenhydrAMINE, naloxone **AND** sodium chloride, ondansetron (ZOFRAN) IV  Xrays Dg Chest Port 1 View  05/19/2015   CLINICAL DATA:  Follow up pneumothorax, chest tube in place.  EXAM: PORTABLE CHEST - 1 VIEW  COMPARISON:  Yesterday at 641 hour  FINDINGS: Tip of the right chest tube at the lung apex. Unchanged small right apical pneumothorax. Sutures noted the right lung apex. No mediastinal shift. The lungs are otherwise clear. Cardiomediastinal contours are normal. Pulmonary vasculature is normal.  IMPRESSION: Stable small right apical pneumothorax with right chest tube in place.   Electronically Signed   By: Rubye Oaks M.D.   On: 05/19/2015 05:18   Dg Chest Port 1 View  05/18/2015   CLINICAL DATA:  Follow-up of pneumothorax  EXAM: PORTABLE CHEST - 1 VIEW  COMPARISON:  AP portable chest x-ray of May 17, 2015  FINDINGS: There is a persistent right apical pneumothorax. It has not significantly changed since yesterday's study. The right-sided chest tube tip projects over the medial aspect of the fourth rib. There is no mediastinal shift. There is no infiltrate or pleural effusion. The heart and mediastinal structures are normal. The bony thorax exhibits no acute abnormality.  IMPRESSION: Stable small right apical pneumothorax. Stable positioning of the right-sided chest tube.   Electronically Signed  By: David  SwazilandJordan M.D.   On: 05/18/2015 07:49    Assessment/Plan: S/P Procedure(s) (LRB): VIDEO ASSISTED THORACOSCOPY (Right) STAPLING OF BLEBS (Right)  1 doing well, no air leak, CXR is stable. D/C CT- home in am if no new issues     LOS: 9 days    GOLD,WAYNE E 05/19/2015  Patient seen and examined, agree with above  Viviann SpareSteven C. Dorris FetchHendrickson, MD [facility] [phone]

## 2015-05-20 ENCOUNTER — Inpatient Hospital Stay (HOSPITAL_COMMUNITY): Payer: 59

## 2015-05-20 DIAGNOSIS — M79609 Pain in unspecified limb: Secondary | ICD-10-CM

## 2015-05-20 MED ORDER — OXYCODONE HCL 5 MG PO TABS
5.0000 mg | ORAL_TABLET | Freq: Four times a day (QID) | ORAL | Status: DC | PRN
  Administered 2015-05-20: 5 mg via ORAL
  Filled 2015-05-20: qty 1

## 2015-05-20 MED ORDER — OXYCODONE HCL 5 MG PO TABS: 5.0000 mg | ORAL_TABLET | Freq: Four times a day (QID) | ORAL | Status: DC | PRN

## 2015-05-20 NOTE — Progress Notes (Addendum)
[address]       [place] [id number]             [phone]      5 Days Post-Op Procedure(s) (LRB): VIDEO ASSISTED THORACOSCOPY (Right) STAPLING OF BLEBS (Right) Subjective: Feels good, no SOB  Objective: Vital signs in last 24 hours: Temp:  [97.7 F (36.5 C)-98.4 F (36.9 C)] 98 F (36.7 C) (05/22 0732) Pulse Rate:  [75-110] 78 (05/22 0732) Cardiac Rhythm:  [-] Sinus tachycardia (05/21 2000) Resp:  [11-28] 11 (05/22 0732) BP: (93-129)/(41-61) 97/45 mmHg (05/22 0732) SpO2:  [98 %-100 %] 100 % (05/22 0732)  Hemodynamic parameters for last 24 hours:    Intake/Output from previous day: 05/21 0701 - 05/22 0700 In: 90 [I.V.:90] Out: 1900 [Urine:1900] Intake/Output this shift:    General appearance: alert, cooperative and no distress Heart: regular rate and rhythm Lungs: clear to auscultation bilaterally Wound: incis healing well  Lab Results: No results for input(s): WBC, HGB, HCT, PLT in the last 72 hours. BMET: No results for input(s): NA, K, CL, CO2, GLUCOSE, BUN, CREATININE, CALCIUM in the last 72 hours.  PT/INR: No results for input(s): LABPROT, INR in the last 72 hours. ABG    Component Value Date/Time   PHART 7.445 05/16/2015 0345   HCO3 23.9 05/16/2015 0345   TCO2 24.9 05/16/2015 0345   O2SAT 99.0 05/16/2015 0345   CBG (last 3)  No results for input(s): GLUCAP in the last 72 hours.  Meds Scheduled Meds: . acetaminophen  1,000 mg Oral 4 times per day   Or  . acetaminophen (TYLENOL) oral liquid 160 mg/5 mL  1,000 mg Oral 4 times per day  . bisacodyl  10 mg Oral Daily  . feeding supplement (RESOURCE BREEZE)  1 Container Oral TID BM  . fentaNYL   Intravenous 6 times per day  . levalbuterol  0.63 mg Nebulization BID  . prenatal multivitamin  1 tablet Oral QHS  . senna-docusate  1 tablet Oral QHS   Continuous Infusions: . dextrose 5 % and 0.45% NaCl 10 mL/hr at 05/17/15 0806   PRN Meds:.calcium carbonate, diphenhydrAMINE  **OR** diphenhydrAMINE, naloxone **AND** sodium chloride, ondansetron (ZOFRAN) IV  Xrays Dg Chest 1v Repeat Same Day  05/19/2015   CLINICAL DATA:  Patient with right-sided pneumothorax status post chest tube removal.  EXAM: CHEST - 1 VIEW SAME DAY  COMPARISON:  05/19/2015  FINDINGS: Monitoring leads overlie the patient. Stable cardiac and mediastinal contours. Interval removal of right chest tube. Stable small right apical pneumothorax. Sutures within the right lung apex. Lungs are clear bilaterally. Small amount of subcutaneous gas in the tissues overlying the right lower lateral hemi thorax.  IMPRESSION: Stable small right apical pneumothorax status post chest tube removal.   Electronically Signed   By: Annia Beltrew  Davis M.D.   On: 05/19/2015 14:56   Dg Chest Port 1 View  05/19/2015   CLINICAL DATA:  Follow up pneumothorax, chest tube in place.  EXAM: PORTABLE CHEST - 1 VIEW  COMPARISON:  Yesterday at 641 hour  FINDINGS: Tip of the right chest tube at the lung apex. Unchanged small right apical pneumothorax. Sutures noted the right lung apex. No mediastinal shift. The lungs are otherwise clear. Cardiomediastinal contours are normal. Pulmonary vasculature is normal.  IMPRESSION: Stable small right apical pneumothorax with right chest tube in place.   Electronically Signed   By: Rubye OaksMelanie  Ehinger M.D.   On: 05/19/2015 05:18   Dg Chest Port 1  View  05/18/2015   CLINICAL DATA:  Follow-up of pneumothorax  EXAM: PORTABLE CHEST - 1 VIEW  COMPARISON:  AP portable chest x-ray of May 17, 2015  FINDINGS: There is a persistent right apical pneumothorax. It has not significantly changed since yesterday's study. The right-sided chest tube tip projects over the medial aspect of the fourth rib. There is no mediastinal shift. There is no infiltrate or pleural effusion. The heart and mediastinal structures are normal. The bony thorax exhibits no acute abnormality.  IMPRESSION: Stable small right apical pneumothorax. Stable  positioning of the right-sided chest tube.   Electronically Signed   By: David  [place] M.D.   On: 05/18/2015 07:49    Assessment/Plan: S/P Procedure(s) (LRB): VIDEO ASSISTED THORACOSCOPY (Right) STAPLING OF BLEBS (Right)  CXR is stable, ok for discharge   LOS: 10 days    GOLD,WAYNE E 05/20/2015  Addendum - she has some edema and calf tenderness in  left leg. Will need vasc lab to check for DVT- hold discharge  Patient seen and examined. Agree with above She has minimal swelling in the left leg but has definite calf tenderness. Needs duplex to r/o DVT, hopefully will be able to get one today  Viviann Spare C. Dorris Fetch, MD [facility] [phone]

## 2015-05-20 NOTE — Progress Notes (Signed)
Discussed AVS with pt and husband. Prescription given. Both verbalize understanding. Elink and CCMD notified of discharge, monitor and IV removed.

## 2015-05-20 NOTE — Progress Notes (Signed)
Discontinued PCA. 4mL fentanyl WIS. Witnessed by Spero GeraldsKenyetta, RN.

## 2015-05-20 NOTE — Progress Notes (Signed)
Pt c/o left lower calf pain with dorsiflexion of foot. Pt has BLE swelling, left is greater than right. 2+ palpable pulses, warm to touch. Paged Gershon CraneWayne Gold, [place]. Ordered doppler. Will continue to monitor.

## 2015-05-20 NOTE — Progress Notes (Signed)
*  Preliminary Results* Bilateral lower extremity venous duplex completed. Bilateral lower extremities are negative for deep vein thrombosis. There is no evidence of Baker's cyst bilaterally.  05/20/2015  Gertie FeyMichelle Shadawn Hanaway, RVT, RDCS, RDMS

## 2015-05-20 NOTE — Discharge Instructions (Signed)
Pneumothorax °A pneumothorax, commonly called a collapsed lung, is a condition in which air leaks from a lung and builds up in the space between the lung and the chest wall (pleural space). The air in a pneumothorax is trapped outside the lung and takes up space, preventing the lung from fully expanding. This is a condition that usually occurs suddenly. The buildup of air may be small or large. A small pneumothorax may go away on its own. When a pneumothorax is larger, it will often require medical treatment and hospitalization.  °CAUSES  °A pneumothorax can sometimes happen quickly with no apparent cause. People with underlying lung problems, particularly COPD or emphysema, are at higher risk of pneumothorax. However, pneumothorax can happen quickly even in people with no prior known lung problems. Trauma, surgery, medical procedures, or injury to the chest wall can also cause a pneumothorax. °SIGNS AND SYMPTOMS  °Sometimes a pneumothorax will have no symptoms. When symptoms are present, they can include: °· Chest pain. °· Shortness of breath. °· Increased rate of breathing. °· Bluish color to your lips or skin (cyanosis). °DIAGNOSIS  °Pneumothorax is usually diagnosed by a chest X-ray or chest CT scan. Your health care provider will also take a medical history and perform a physical exam to determine why you may have a pneumothorax. °TREATMENT  °A small pneumothorax may go away on its own without treatment. Extra oxygen can sometimes help a small pneumothorax go away more quickly. For a larger pneumothorax or a pneumothorax that is causing symptoms, a procedure is usually needed to drain the air. In some cases, the health care provider may drain the air using a needle. In other cases, a chest tube may be inserted into the pleural space. A chest tube is a small tube placed between the ribs and into the pleural space. This removes the extra air and allows the lung to expand back to its normal size. A large  pneumothorax will usually require a hospital stay. If there is ongoing air leakage into the pleural space, then the chest tube may need to remain in place for several days until the air leak has healed. In some cases, surgery may be needed.  °HOME CARE INSTRUCTIONS  °· Only take over-the-counter or prescription medicines as directed by your health care provider. °· If a cough or pain makes it difficult for you to sleep at night, try sleeping in a semi-upright position in a recliner or by using 2 or 3 pillows. °· Rest and limit activity as directed by your health care provider. °· If you had a chest tube and it was removed, ask your health care provider when it is okay to remove the dressing. Until your health care provider says you can remove the dressing, do not allow it to get wet. °· Do not smoke. Smoking is a risk factor for pneumothorax. °· Do not fly in an airplane or scuba dive until your health care provider says it is okay. °· Follow up with your health care provider as directed. °SEEK IMMEDIATE MEDICAL CARE IF:  °· You have increasing chest pain or shortness of breath. °· You have a cough that is not controlled with suppressants. °· You begin coughing up blood. °· You have pain that is getting worse or is not controlled with medicines. °· You cough up thick, discolored mucus (sputum) that is yellow to green in color. °· You have redness, increasing pain, or discharge at the site where a chest tube had been in place (if   your pneumothorax was treated with a chest tube).  The site where your chest tube was located opens up.  You feel air coming out of the site where the chest tube was placed.  You have a fever or persistent symptoms for more than 2-3 days.  You have a fever and your symptoms suddenly get worse. MAKE SURE YOU:   Understand these instructions.  Will watch your condition.  Will get help right away if you are not doing well or get worse. Document Released: 12/15/2005 Document  Revised: 10/05/2013 Document Reviewed: 07/14/2013 [facility] Patient Information 2015 [place], [place]. This information is not intended to replace advice given to you by your health care provider. Make sure you discuss any questions you have with your health care provider. Thoracoscopy Care After Refer to this sheet in the next few weeks. These discharge instructions provide you with general information on caring for yourself after you leave the hospital. Your caregiver may also give you specific instructions. Your treatment has been planned according to the most current medical practices available, but unavoidable complications sometimes occur. If you have any problems or questions after discharge, call your caregiver. HOME CARE INSTRUCTIONS   Remove the bandage (dressing) over your chest tube site as directed by your caregiver.  It is normal to be sore for a couple weeks following surgery. See your caregiver if this seems to be getting worse rather than better.  Only take over-the-counter or prescription medicines for pain, discomfort, or fever as directed by your caregiver. It is very important to take pain medicine when you need it so that you will cough and breathe deeply enough to clear mucus (phlegm) and expand your lungs.  If it hurts to cough, hold a pillow against your chest when you cough. This may help with the discomfort. In spite of the discomfort, cough frequently, as this helps protect against getting an infection in your lung (pneumonia).  Taking deep breaths keeps lungs inflated and protects against pneumonia. Most patients will go home with an incentive spirometer that encourages deep breathing.  You may resume a normal diet and activities as directed.  Use showers for bathing until you see your caregiver, or as instructed.  Change dressings if necessary or as directed.  Avoid lifting or driving until you are instructed otherwise.  Make an appointment to see your caregiver  for stitch (suture) or staple removal when instructed.  Do not travel by airplane for 2 weeks after the chest tube is removed. SEEK MEDICAL CARE IF:   You are bleeding from your wounds.  You have redness, swelling, or increasing pain in the wounds.  Your heartbeat feels irregular or very fast.  There is pus coming from your wounds.  There is a bad smell coming from the wound or dressing. SEEK IMMEDIATE MEDICAL CARE IF:   You have a fever.  You develop a rash.  You have difficulty breathing.  You develop any reaction or side effects to medicines given.  You develop lightheadedness or feel faint.  You develop shortness of breath or chest pain. MAKE SURE YOU:   Understand these instructions.  Will watch your condition.  Will get help right away if you are not doing well or get worse. Document Released: 07/04/2005 Document Revised: 03/08/2012 Document Reviewed: 06/04/2011 [facility] Patient Information 2015 [place], [place]. This information is not intended to replace advice given to you by your health care provider. Make sure you discuss any questions you have with your health care provider.

## 2015-05-22 ENCOUNTER — Ambulatory Visit: Payer: Self-pay | Admitting: *Deleted

## 2015-05-22 DIAGNOSIS — J95811 Postprocedural pneumothorax: Secondary | ICD-10-CM

## 2015-05-22 NOTE — Progress Notes (Signed)
[name] [name] had called with concerns of a piece of bandage that was in her incision she couldn't remove.  On exam the area of concern is actually a previous site that has been packed with a small piece of gauze and covered with a dressing. The area is not very deep and has a good wound base with no sign of infection.  I cleaned the site and repacked it.  I told her that I would recheck it when she comes for suture removal of the chest tube site on Friday and she agrees.

## 2015-05-25 ENCOUNTER — Encounter (INDEPENDENT_AMBULATORY_CARE_PROVIDER_SITE_OTHER): Payer: 59

## 2015-05-25 DIAGNOSIS — J939 Pneumothorax, unspecified: Secondary | ICD-10-CM

## 2015-05-30 ENCOUNTER — Ambulatory Visit: Payer: 59 | Admitting: Surgery

## 2015-06-06 ENCOUNTER — Other Ambulatory Visit: Payer: Self-pay | Admitting: Surgery

## 2015-06-06 ENCOUNTER — Ambulatory Visit: Payer: 59 | Admitting: Surgery

## 2015-06-06 DIAGNOSIS — J939 Pneumothorax, unspecified: Secondary | ICD-10-CM

## 2015-06-08 ENCOUNTER — Encounter: Payer: Self-pay | Admitting: Surgery

## 2015-06-08 ENCOUNTER — Ambulatory Visit: Payer: 59 | Admitting: Surgery

## 2015-06-08 ENCOUNTER — Ambulatory Visit (INDEPENDENT_AMBULATORY_CARE_PROVIDER_SITE_OTHER): Payer: Self-pay | Admitting: Surgery

## 2015-06-08 ENCOUNTER — Ambulatory Visit
Admission: RE | Admit: 2015-06-08 | Discharge: 2015-06-08 | Disposition: A | Discharge status: Home / Self Care | Payer: 59 | Source: Ambulatory Visit | Attending: Surgery | Admitting: Surgery

## 2015-06-08 DIAGNOSIS — J930 Spontaneous tension pneumothorax: Secondary | ICD-10-CM

## 2015-06-08 DIAGNOSIS — J939 Pneumothorax, unspecified: Secondary | ICD-10-CM

## 2015-06-08 NOTE — Progress Notes (Signed)
       HPI:  Patient returns for routine postoperative follow-up having undergone right VATS with bleb stapling and mechanical pleurodesis on 05/15/2015. The patient's early postoperative recovery while in the hospital was notable for an uncomplicated postop course. The final pathology showed blebs with no sign of malignancy. Since hospital discharge the patient reports that she has been feeling well. She has abstained from smoking. Her pregnancy is going well and her baby is growing.   Current Outpatient Prescriptions  Medication Sig Dispense Refill  . acetaminophen (TYLENOL) 500 MG tablet Take 1,000 mg by mouth every 6 (six) hours as needed for fever or headache.    . Prenatal Vit-Fe Fumarate-FA (PRENATAL MULTIVITAMIN) TABS tablet Take 1 tablet by mouth at bedtime.     No current facility-administered medications for this visit.    Physical Exam: BP 115/73 mmHg  Pulse 86  Resp 86  Ht 5\' 3"  (1.6 m)  Wt 130 lb (58.968 kg)  BMI 23.03 kg/m2  SpO2 99% She looks well Lungs are clear The right chest incisions are healing well  Diagnostic Tests:  CLINICAL DATA: Pneumothorax follow-up.  EXAM: CHEST 2 VIEW  COMPARISON: 05/20/2015.  FINDINGS: Mediastinum and hilar structures are normal. Interim resolution of right apical pneumothorax. Surgical sutures are noted in the right upper lobe. No focal infiltrate. No pleural effusion or pneumothorax. Heart size normal. No acute bony abnormality .  IMPRESSION: Postsurgical changes right upper lobe with interim resolution of right apical pneumothorax.   Electronically Signed  By: Maisie Fus Register  On: 06/08/2015 09:20   Impression:  She is doing well following her surgery. I told her she can return to normal activity. I encouraged her to continue abstaining from smoking.  Plan:  She will continue to follow up with her OB and primary physicians and will contact me if she develops any recurrent chest pain or  shortness of breath similar to her prior symptoms, although I think the chance of recurrent ptx on the right side is very low.   Alleen Borne, MD [facility] [phone]

## 2015-09-17 ENCOUNTER — Inpatient Hospital Stay (HOSPITAL_COMMUNITY)
Admission: AD | Admit: 2015-09-17 | Discharge: 2015-09-18 | Disposition: A | Discharge status: Home / Self Care | Payer: 59 | Source: Ambulatory Visit | Attending: Obstetrics and Gynecology | Admitting: Obstetrics and Gynecology

## 2015-09-17 ENCOUNTER — Encounter (HOSPITAL_COMMUNITY): Payer: Self-pay | Admitting: *Deleted

## 2015-09-17 DIAGNOSIS — Z3A35 35 weeks gestation of pregnancy: Secondary | ICD-10-CM | POA: Insufficient documentation

## 2015-09-17 NOTE — MAU Provider Note (Signed)
Subjective: [name] [name] is a 29 y.o. [phone] at [redacted]w[redacted]d who presents today with contractions. She states that she has been having contractions x 4 days. She states that they were worse today starting around 2000. She denies any vaginal bleeding or LOF. She states that the fetus has been active. She denies fever, nausea/vomting or urinary sx. She has an appointment in the office on Wednesday. She denies any pregnancy specific complications. She has had many social stressors, and "I had a collapsed lung in May." Objective: VSS, afebril Abdomen: soft, nontender HEENT: normocephalic Cardiovascular: RRR Respiratory: breathing normal Neurological: A&O X 3 Skin: warm and dry SVE: 1.5/30 per RN  FHT: 145, moderate with 15x15 accels, no decels Toco: q 3-7 min crx 2319: Cervix now 2/40. RN spoke with Dr. Jackelyn Knife, will watch for one more hour.  0144: Patient has not had any cervical change. RN spoke with Dr. Jackelyn Knife. Will give nubain and then DC home Assessment/Plan: Prodromal v early labor DC home with meds for rest Labor precautions  Fetal kick counts Return to MAU as needed

## 2015-09-18 ENCOUNTER — Inpatient Hospital Stay (HOSPITAL_COMMUNITY)
Admission: AD | Admit: 2015-09-18 | Discharge: 2015-09-18 | Disposition: A | Discharge status: Home / Self Care | Payer: 59 | Source: Ambulatory Visit | Attending: Obstetrics and Gynecology | Admitting: Obstetrics and Gynecology

## 2015-09-18 ENCOUNTER — Encounter (HOSPITAL_COMMUNITY): Payer: Self-pay

## 2015-09-18 DIAGNOSIS — Z3A35 35 weeks gestation of pregnancy: Secondary | ICD-10-CM | POA: Diagnosis not present

## 2015-09-18 MED ORDER — NALBUPHINE HCL 10 MG/ML IJ SOLN
10.0000 mg | Freq: Once | INTRAMUSCULAR | Status: AC
  Administered 2015-09-18: 10 mg via INTRAVENOUS
  Filled 2015-09-18 (×2): qty 1

## 2015-09-18 NOTE — MAU Note (Signed)
Per Zorita Pang CNM, recheck pt in 1.5 hours

## 2015-09-18 NOTE — Discharge Instructions (Signed)
Braxton Hicks Contractions °Contractions of the uterus can occur throughout pregnancy. Contractions are not always a sign that you are in labor.  °WHAT ARE BRAXTON HICKS CONTRACTIONS?  °Contractions that occur before labor are called Braxton Hicks contractions, or false labor. Toward the end of pregnancy (32-34 weeks), these contractions can develop more often and may become more forceful. This is not true labor because these contractions do not result in opening (dilatation) and thinning of the cervix. They are sometimes difficult to tell apart from true labor because these contractions can be forceful and people have different pain tolerances. You should not feel embarrassed if you go to the hospital with false labor. Sometimes, the only way to tell if you are in true labor is for your health care provider to look for changes in the cervix. °If there are no prenatal problems or other health problems associated with the pregnancy, it is completely safe to be sent home with false labor and await the onset of true labor. °HOW CAN YOU TELL THE DIFFERENCE BETWEEN TRUE AND FALSE LABOR? °False Labor °· The contractions of false labor are usually shorter and not as hard as those of true labor.   °· The contractions are usually irregular.   °· The contractions are often felt in the front of the lower abdomen and in the groin.   °· The contractions may go away when you walk around or change positions while lying down.   °· The contractions get weaker and are shorter lasting as time goes on.   °· The contractions do not usually become progressively stronger, regular, and closer together as with true labor.   °True Labor °· Contractions in true labor last 30-70 seconds, become very regular, usually become more intense, and increase in frequency.   °· The contractions do not go away with walking.   °· The discomfort is usually felt in the top of the uterus and spreads to the lower abdomen and low back.   °· True labor can be  determined by your health care provider with an exam. This will show that the cervix is dilating and getting thinner.   °WHAT TO REMEMBER °· Keep up with your usual exercises and follow other instructions given by your health care provider.   °· Take medicines as directed by your health care provider.   °· Keep your regular prenatal appointments.   °· Eat and drink lightly if you think you are going into labor.   °· If Braxton Hicks contractions are making you uncomfortable:   °¨ Change your position from lying down or resting to walking, or from walking to resting.   °¨ Sit and rest in a tub of warm water.   °¨ Drink 2-3 glasses of water. Dehydration may cause these contractions.   °¨ Do slow and deep breathing several times an hour.   °WHEN SHOULD I SEEK IMMEDIATE MEDICAL CARE? °Seek immediate medical care if: °· Your contractions become stronger, more regular, and closer together.   °· You have fluid leaking or gushing from your vagina.   °· You have a fever.    °· You have vaginal bleeding.   °· You have continuous abdominal pain.   °· You have low back pain that you never had before.   °· You feel your baby's head pushing down and causing pelvic pressure.   °· Your baby is not moving as much as it used to.   °Document Released: 12/15/2005 Document Revised: 12/20/2013 Document Reviewed: 09/26/2013 °[employer]® Patient Information ©2015 [employer]. This information is not intended to replace advice given to you by your health care provider. Make sure you discuss any   questions you have with your health care provider. ° °

## 2015-09-18 NOTE — MAU Note (Signed)
Pt evaluated earlier and sent home. Contractions 3 mins apart with a lot of pressure. Denies LOF or vag bleeding. +FM

## 2015-09-18 NOTE — Progress Notes (Signed)
Pt. States contractions have spaced out. She has been able to sleep for the past half hour

## 2015-09-19 LAB — OB RESULTS CONSOLE GBS: STREP GROUP B AG: NEGATIVE

## 2015-09-19 NOTE — Progress Notes (Signed)
FHT from 9-19 to 9-20 reviewed.  Reactive NSTs, no significant decels, some regular ctx that spaced out.

## 2015-09-26 ENCOUNTER — Inpatient Hospital Stay (HOSPITAL_COMMUNITY)
Admission: AD | Admit: 2015-09-26 | Discharge: 2015-09-26 | Disposition: A | Discharge status: Home / Self Care | Payer: 59 | Source: Ambulatory Visit | Attending: Obstetrics and Gynecology | Admitting: Obstetrics and Gynecology

## 2015-09-26 DIAGNOSIS — R51 Headache: Secondary | ICD-10-CM | POA: Diagnosis present

## 2015-09-26 DIAGNOSIS — O133 Gestational [pregnancy-induced] hypertension without significant proteinuria, third trimester: Secondary | ICD-10-CM | POA: Insufficient documentation

## 2015-09-26 DIAGNOSIS — E876 Hypokalemia: Secondary | ICD-10-CM

## 2015-09-26 DIAGNOSIS — O4703 False labor before 37 completed weeks of gestation, third trimester: Secondary | ICD-10-CM

## 2015-09-26 DIAGNOSIS — Z3A36 36 weeks gestation of pregnancy: Secondary | ICD-10-CM | POA: Insufficient documentation

## 2015-09-26 DIAGNOSIS — O479 False labor, unspecified: Secondary | ICD-10-CM

## 2015-09-26 LAB — URINALYSIS, ROUTINE W REFLEX MICROSCOPIC
BILIRUBIN URINE: NEGATIVE
GLUCOSE, UA: NEGATIVE mg/dL
HGB URINE DIPSTICK: NEGATIVE
Ketones, ur: NEGATIVE mg/dL
Nitrite: NEGATIVE
PROTEIN: NEGATIVE mg/dL
Specific Gravity, Urine: 1.01 (ref 1.005–1.030)
Urobilinogen, UA: 0.2 mg/dL (ref 0.0–1.0)
pH: 7.5 (ref 5.0–8.0)

## 2015-09-26 LAB — CBC
HCT: 31.7 % — ABNORMAL LOW (ref 36.0–46.0)
Hemoglobin: 10.8 g/dL — ABNORMAL LOW (ref 12.0–15.0)
MCH: 30.6 pg (ref 26.0–34.0)
MCHC: 34.1 g/dL (ref 30.0–36.0)
MCV: 89.8 fL (ref 78.0–100.0)
PLATELETS: 253 10*3/uL (ref 150–400)
RBC: 3.53 MIL/uL — ABNORMAL LOW (ref 3.87–5.11)
RDW: 14 % (ref 11.5–15.5)
WBC: 16.8 10*3/uL — ABNORMAL HIGH (ref 4.0–10.5)

## 2015-09-26 LAB — PROTEIN / CREATININE RATIO, URINE
Creatinine, Urine: 50 mg/dL
PROTEIN CREATININE RATIO: 0.16 mg/mg{creat} — AB (ref 0.00–0.15)
TOTAL PROTEIN, URINE: 8 mg/dL

## 2015-09-26 LAB — COMPREHENSIVE METABOLIC PANEL
ALBUMIN: 3 g/dL — AB (ref 3.5–5.0)
ALT: 19 U/L (ref 14–54)
ANION GAP: 9 (ref 5–15)
AST: 28 U/L (ref 15–41)
Alkaline Phosphatase: 138 U/L — ABNORMAL HIGH (ref 38–126)
BUN: <5 mg/dL — ABNORMAL LOW (ref 6–20)
CHLORIDE: 107 mmol/L (ref 101–111)
CO2: 23 mmol/L (ref 22–32)
Calcium: 8.2 mg/dL — ABNORMAL LOW (ref 8.9–10.3)
Creatinine, Ser: 0.61 mg/dL (ref 0.44–1.00)
GFR calc non Af Amer: >60 mL/min (ref >60)
GLUCOSE: 98 mg/dL (ref 65–99)
POTASSIUM: 2.8 mmol/L — AB (ref 3.5–5.1)
SODIUM: 139 mmol/L (ref 135–145)
Total Bilirubin: 0.4 mg/dL (ref 0.3–1.2)
Total Protein: 5.9 g/dL — ABNORMAL LOW (ref 6.5–8.1)

## 2015-09-26 LAB — URINE MICROSCOPIC-ADD ON

## 2015-09-26 LAB — URIC ACID: URIC ACID, SERUM: 4.1 mg/dL (ref 2.3–6.6)

## 2015-09-26 LAB — LACTATE DEHYDROGENASE: LDH: 131 U/L (ref 98–192)

## 2015-09-26 MED ORDER — ACETAMINOPHEN 325 MG PO TABS
650.0000 mg | ORAL_TABLET | Freq: Once | ORAL | Status: AC
  Administered 2015-09-26: 650 mg via ORAL
  Filled 2015-09-26: qty 2

## 2015-09-26 MED ORDER — POTASSIUM CHLORIDE CRYS ER 20 MEQ PO TBCR
40.0000 meq | EXTENDED_RELEASE_TABLET | Freq: Once | ORAL | Status: AC
  Administered 2015-09-26: 40 meq via ORAL
  Filled 2015-09-26: qty 2

## 2015-09-26 NOTE — MAU Provider Note (Signed)
History     CSN: [id number]  Arrival date and time: 09/26/15 1251   First Valera Vallas Initiated Contact with Patient 09/26/15 1426          Chief Complaint  Patient presents with  . Hypertension   HPI  [name] [name] is a 29 y.o. [id number] at 75w5dwho presents for hypertension evaluation by Dr. BTerri Piedra  BP elevated in office today. Pt reports headache since lat night. Took tylenol at home with no relief. Rates as 8/10 with photophobia. Has hx of headaches like this.  Denies vision changes or epigastric pain.    OB History    Gravida Para Term Preterm AB TAB SAB Ectopic Multiple Living   '10 3 3  6 ' 0 6 0 0 1      Past Medical History  Diagnosis Date  . Headache   . Infection     UTI  . Fracture of right hand     x3  . Fracture of right ankle   . Depression   . Anxiety   . PTSD (post-traumatic stress disorder)     lost 2 oldest children in house fire  . Collapse of right lung     Past Surgical History  Procedure Laterality Date  . Dilation and curettage of uterus      x2  . Hemorrhoid surgery    . Other surgical history      osteochondroma removed from Left shoulder  . Video assisted thoracoscopy Right 05/15/2015    Procedure: VIDEO ASSISTED THORACOSCOPY;  Surgeon: BGaye Pollack MD;  [facility];  Service: Thoracic;  Laterality: Right;  . Stapling of blebs Right 05/15/2015    Procedure: STAPLING OF BLEBS;  Surgeon: BGaye Pollack MD;  Location: [facility]  Service: Thoracic;  Laterality: Right;  . Shoulder surgery      Family History  Problem Relation Age of Onset  . Hypertension Father   . Asthma Son   . Cancer Paternal Grandfather     colon  . Heart disease Paternal Grandfather     die from heart attack    Social History  Substance Use Topics  . Smoking status: Current Every Day Smoker -- 0.50 packs/day for 20 years    Types: Cigarettes  . Smokeless tobacco: Never Used     Comment: Feb 2016  . Alcohol Use: No    Allergies:  Allergies   Allergen Reactions  . Ketorolac Other (See Comments)    In PILL form only, causes stomach cramps, fine with inj.    Prescriptions prior to admission  Medication Sig Dispense Refill Last Dose  . acetaminophen (TYLENOL) 500 MG tablet Take 1,000 mg by mouth every 6 (six) hours as needed for fever or headache.   09/26/2015 at Unknown time  . calcium carbonate (TUMS EX) 750 MG chewable tablet Chew 2 tablets by mouth 3 (three) times daily as needed for heartburn.    09/26/2015 at Unknown time  . cyclobenzaprine (FLEXERIL) 10 MG tablet Take 20 mg by mouth at bedtime.    09/25/2015 at Unknown time  . Docusate Calcium (STOOL SOFTENER PO) Take 1 tablet by mouth daily.   09/25/2015 at Unknown time  . Prenatal Vit-Fe Fumarate-FA (PRENATAL MULTIVITAMIN) TABS tablet Take 1 tablet by mouth at bedtime.   09/25/2015 at Unknown time    Review of Systems  Constitutional: Negative for fever and chills.  HENT: Negative for tinnitus.   Eyes: Positive for photophobia. Negative for blurred vision.  Respiratory: Negative.  Cardiovascular: Negative.  Negative for chest pain.  Gastrointestinal: Negative.   Genitourinary: Negative.   Neurological: Positive for headaches. Negative for dizziness.   Physical Exam   Blood pressure 129/71, pulse 91.   130/75, pulse 99   Physical Exam  Nursing note and vitals reviewed. Constitutional: She is oriented to person, place, and time. She appears well-developed and well-nourished. No distress.  HENT:  Head: Normocephalic and atraumatic.  Eyes: Conjunctivae are normal. Right eye exhibits no discharge. Left eye exhibits no discharge. No scleral icterus.  Neck: Normal range of motion.  Cardiovascular: Normal rate, regular rhythm and normal heart sounds.   No murmur heard. Respiratory: Effort normal and breath sounds normal. No respiratory distress. She has no wheezes.  GI: Soft.  Musculoskeletal: She exhibits no edema.  Neurological: She is alert and oriented to  person, place, and time. She has normal reflexes.  No clonus  Skin: Skin is warm and dry. She is not diaphoretic.  Psychiatric: She has a normal mood and affect. Her behavior is normal. Judgment and thought content normal.   Fetal Tracing:  Baseline: 145 Variability: moderate Accelerations: 15x15 Decelerations: none  Toco: 2-6 mins, mild  Dilation: 2.5 Effacement (%): Thick Cervical Position: Posterior Station: -3 Exam by:: Jorje Guild NP   MAU Course  Procedures Results for orders placed or performed during the hospital encounter of 09/26/15 (from the past 48 hour(s))  Protein / creatinine ratio, urine     Status: Abnormal   Collection Time: 09/26/15  1:15 PM  Result Value Ref Range   Creatinine, Urine 50.00 mg/dL   Total Protein, Urine 8 mg/dL    Comment: NO NORMAL RANGE ESTABLISHED FOR THIS TEST   Protein Creatinine Ratio 0.16 (H) 0.00 - 0.15 mg/mg[Cre]  Urinalysis, Routine w reflex microscopic (not at [facility])     Status: Abnormal   Collection Time: 09/26/15  1:15 PM  Result Value Ref Range   Color, Urine YELLOW YELLOW   APPearance CLEAR CLEAR   Specific Gravity, Urine 1.010 1.005 - 1.030   pH 7.5 5.0 - 8.0   Glucose, UA NEGATIVE NEGATIVE mg/dL   Hgb urine dipstick NEGATIVE NEGATIVE   Bilirubin Urine NEGATIVE NEGATIVE   Ketones, ur NEGATIVE NEGATIVE mg/dL   Protein, ur NEGATIVE NEGATIVE mg/dL   Urobilinogen, UA 0.2 0.0 - 1.0 mg/dL   Nitrite NEGATIVE NEGATIVE   Leukocytes, UA TRACE (A) NEGATIVE  Urine microscopic-add on     Status: Abnormal   Collection Time: 09/26/15  1:15 PM  Result Value Ref Range   Squamous Epithelial / LPF FEW (A) RARE   WBC, UA 0-2 <3 WBC/hpf   Bacteria, UA FEW (A) RARE  CBC     Status: Abnormal   Collection Time: 09/26/15  1:22 PM  Result Value Ref Range   WBC 16.8 (H) 4.0 - 10.5 K/uL   RBC 3.53 (L) 3.87 - 5.11 MIL/uL   Hemoglobin 10.8 (L) 12.0 - 15.0 g/dL   HCT 31.7 (L) 36.0 - 46.0 %   MCV 89.8 78.0 - 100.0 fL   MCH 30.6 26.0 -  34.0 pg   MCHC 34.1 30.0 - 36.0 g/dL   RDW 14.0 11.5 - 15.5 %   Platelets 253 150 - 400 K/uL  Comprehensive metabolic panel     Status: Abnormal   Collection Time: 09/26/15  1:22 PM  Result Value Ref Range   Sodium 139 135 - 145 mmol/L   Potassium 2.8 (L) 3.5 - 5.1 mmol/L   Chloride 107 101 -  111 mmol/L   CO2 23 22 - 32 mmol/L   Glucose, Bld 98 65 - 99 mg/dL   BUN <5 (L) 6 - 20 mg/dL    Comment: REPEATED TO VERIFY   Creatinine, Ser 0.61 0.44 - 1.00 mg/dL   Calcium 8.2 (L) 8.9 - 10.3 mg/dL   Total Protein 5.9 (L) 6.5 - 8.1 g/dL   Albumin 3.0 (L) 3.5 - 5.0 g/dL   AST 28 15 - 41 U/L   ALT 19 14 - 54 U/L   Alkaline Phosphatase 138 (H) 38 - 126 U/L   Total Bilirubin 0.4 0.3 - 1.2 mg/dL   GFR calc non Af Amer >60 >60 mL/min   GFR calc Af Amer >60 >60 mL/min    Comment: (NOTE) The eGFR has been calculated using the CKD EPI equation. This calculation has not been validated in all clinical situations. eGFR's persistently <60 mL/min signify possible Chronic Kidney Disease.    Anion gap 9 5 - 15  Lactate dehydrogenase     Status: None   Collection Time: 09/26/15  1:22 PM  Result Value Ref Range   LDH 131 98 - 192 U/L  Uric acid     Status: None   Collection Time: 09/26/15  1:22 PM  Result Value Ref Range   Uric Acid, Serum 4.1 2.3 - 6.6 mg/dL    MDM Category 1 tracing Tylenol for headache per Dr. Terri Piedra Dr. Terri Piedra made aware of BPs & labs. K dur for hypokalemia. Ok to discharge home.   Assessment and Plan  A: 1. Transient hypertension of pregnancy in third trimester   2. Braxton Hick's contraction   3. Hypokalemia    P: Discharge home S/s preeclampsia Discussed reasons to return to MAU Info given about potassium in diet.    Jorje Guild, NP   09/26/2015, 2:23 PM

## 2015-09-26 NOTE — Discharge Instructions (Signed)
Hypertension During Pregnancy  Hypertension, or high blood pressure, is when there is extra pressure inside your blood vessels that carry blood from the heart to the rest of your body (arteries). It can happen at any time in life, including pregnancy. Hypertension during pregnancy can cause problems for you and your baby. Your baby might not weigh as much as he or she should at birth or might be born early (premature). Very bad cases of hypertension during pregnancy can be life-threatening.   Different types of hypertension can occur during pregnancy. These include:  · Chronic hypertension. This happens when a woman has hypertension before pregnancy and it continues during pregnancy.  · Gestational hypertension. This is when hypertension develops during pregnancy.  · Preeclampsia or toxemia of pregnancy. This is a very serious type of hypertension that develops only during pregnancy. It affects the whole body and can be very dangerous for both mother and baby.    Gestational hypertension and preeclampsia usually go away after your baby is born. Your blood pressure will likely stabilize within 6 weeks. Women who have hypertension during pregnancy have a greater chance of developing hypertension later in life or with future pregnancies.  RISK FACTORS  There are certain factors that make it more likely for you to develop hypertension during pregnancy. These include:  · Having hypertension before pregnancy.  · Having hypertension during a previous pregnancy.  · Being overweight.  · Being older than 40 years.  · Being pregnant with more than one baby.  · Having diabetes or kidney problems.  SIGNS AND SYMPTOMS  Chronic and gestational hypertension rarely cause symptoms. Preeclampsia has symptoms, which may include:  · Increased protein in your urine. Your health care provider will check for this at every prenatal visit.  · Swelling of your hands and face.  · Rapid weight gain.  · Headaches.  · Visual changes.  · Being  bothered by light.  · Abdominal pain, especially in the upper right area.  · Chest pain.  · Shortness of breath.  · Increased reflexes.  · Seizures. These occur with a more severe form of preeclampsia, called eclampsia.  DIAGNOSIS   You may be diagnosed with hypertension during a regular prenatal exam. At each prenatal visit, you may have:  · Your blood pressure checked.  · A urine test to check for protein in your urine.  The type of hypertension you are diagnosed with depends on when you developed it. It also depends on your specific blood pressure reading.  · Developing hypertension before 20 weeks of pregnancy is consistent with chronic hypertension.  · Developing hypertension after 20 weeks of pregnancy is consistent with gestational hypertension.  · Hypertension with increased urinary protein is diagnosed as preeclampsia.  · Blood pressure measurements that stay above 160 systolic or 110 diastolic are a sign of severe preeclampsia.  TREATMENT  Treatment for hypertension during pregnancy varies. Treatment depends on the type of hypertension and how serious it is.  · If you take medicine for chronic hypertension, you may need to switch medicines.    Medicines called ACE inhibitors should not be taken during pregnancy.    Low-dose aspirin may be suggested for women who have risk factors for preeclampsia.  · If you have gestational hypertension, you may need to take a blood pressure medicine that is safe during pregnancy. Your health care provider will recommend the correct medicine.  · If you have severe preeclampsia, you may need to be in the hospital. Health care   done to protect you and your baby. The only cure for preeclampsia is delivery.  Your health  care provider may recommend that you take one low-dose aspirin (81 mg) each day to help prevent high blood pressure during your pregnancy if you are at risk for preeclampsia. You may be at risk for preeclampsia if:  You had preeclampsia or eclampsia during a previous pregnancy.  Your baby did not grow as expected during a previous pregnancy.  You experienced preterm birth with a previous pregnancy.  You experienced a separation of the placenta from the uterus (placental abruption) during a previous pregnancy.  You experienced the loss of your baby during a previous pregnancy.  You are pregnant with more than one baby.  You have other medical conditions, such as diabetes or an autoimmune disease. HOME CARE INSTRUCTIONS  Schedule and keep all of your regular prenatal care appointments. This is important.  Take medicines only as directed by your health care provider. Tell your health care provider about all medicines you take.  Eat as little salt as possible.  Get regular exercise.  Do not drink alcohol.  Do not use tobacco products.  Do not drink products with caffeine.  Lie on your left side when resting. SEEK IMMEDIATE MEDICAL CARE IF:  You have severe abdominal pain.  You have sudden swelling in your hands, ankles, or face.  You gain 4 pounds (1.8 kg) or more in 1 week.  You vomit repeatedly.  You have vaginal bleeding.  You do not feel your baby moving as much.  You have a headache.  You have blurred or double vision.  You have muscle twitching or spasms.  You have shortness of breath.  You have blue fingernails or lips.  You have blood in your urine. MAKE SURE YOU:  Understand these instructions.  Will watch your condition.  Will get help right away if you are not doing well or get worse. Document Released: 09/02/2011 Document Revised: 05/01/2014 Document Reviewed: 07/14/2013 [facility] Patient Information 2015 [place], [place]. This information is not  intended to replace advice given to you by your health care provider. Make sure you discuss any questions you have with your health care provider.    Hypokalemia Hypokalemia means that the amount of potassium in the blood is lower than normal.Potassium is a chemical, called an electrolyte, that helps regulate the amount of fluid in the body. It also stimulates muscle contraction and helps nerves function properly.Most of the body's potassium is inside of cells, and only a very small amount is in the blood. Because the amount in the blood is so small, minor changes can be life-threatening. CAUSES  Antibiotics.  Diarrhea or vomiting.  Using laxatives too much, which can cause diarrhea.  Chronic kidney disease.  Water pills (diuretics).  Eating disorders (bulimia).  Low magnesium level.  Sweating a lot. SIGNS AND SYMPTOMS  Weakness.  Constipation.  Fatigue.  Muscle cramps.  Mental confusion.  Skipped heartbeats or irregular heartbeat (palpitations).  Tingling or numbness. DIAGNOSIS  Your health care provider can diagnose hypokalemia with blood tests. In addition to checking your potassium level, your health care provider may also check other lab tests. TREATMENT Hypokalemia can be treated with potassium supplements taken by mouth or adjustments in your current medicines. If your potassium level is very low, you may need to get potassium through a vein (IV) and be monitored in the hospital. A diet high in potassium is also helpful. Foods high in potassium are:  Nuts, such  as peanuts and pistachios.  Seeds, such as sunflower seeds and pumpkin seeds.  Peas, lentils, and lima beans.  Whole grain and bran cereals and breads.  Fresh fruit and vegetables, such as apricots, avocado, bananas, cantaloupe, kiwi, oranges, tomatoes, asparagus, and potatoes.  Orange and tomato juices.  Red meats.  Fruit yogurt. HOME CARE INSTRUCTIONS  Take all medicines as prescribed by  your health care provider.  Maintain a healthy diet by including nutritious food, such as fruits, vegetables, nuts, whole grains, and lean meats.  If you are taking a laxative, be sure to follow the directions on the label. SEEK MEDICAL CARE IF:  Your weakness gets worse.  You feel your heart pounding or racing.  You are vomiting or having diarrhea.  You are diabetic and having trouble keeping your blood glucose in the normal range. SEEK IMMEDIATE MEDICAL CARE IF:  You have chest pain, shortness of breath, or dizziness.  You are vomiting or having diarrhea for more than 2 days.  You faint. MAKE SURE YOU:   Understand these instructions.  Will watch your condition.  Will get help right away if you are not doing well or get worse. Document Released: 12/15/2005 Document Revised: 10/05/2013 Document Reviewed: 06/17/2013 [facility] Patient Information 2015 [place], [place]. This information is not intended to replace advice given to you by your health care provider. Make sure you discuss any questions you have with your health care provider.

## 2015-09-26 NOTE — MAU Note (Addendum)
Sent from office for pre-eclampsia eval, taken directly to rm

## 2015-09-26 NOTE — MAU Note (Signed)
Slight HA, and swelling; no epigastric pain or visual changes.

## 2015-09-27 ENCOUNTER — Encounter: Payer: Self-pay | Admitting: *Deleted

## 2015-09-27 NOTE — Progress Notes (Signed)
Patient ID: [name] [name], female   DOB: Mar 22, 1986, 29 y.o.   MRN: [id number] On 09/24/15, Mrs. Mole stopped by the office for the nurse to check one of her previous surgical sites that her husband had been trying to get to heal for a while.  On exam, one of the VATS sites on the upper posterior back has a bump and small hole beside it at the end of the incision.  I was able to express a small amount of white thick drainage from both areas.  I explored the largest area and removed some retained suture..  The area was cleaned, polysporin and a band aid applied.  Hopefully the area will heal with no further problems.  She will let me know if it does not.

## 2015-10-12 ENCOUNTER — Encounter (HOSPITAL_COMMUNITY): Payer: Self-pay | Admitting: Emergency Medicine

## 2015-10-12 ENCOUNTER — Emergency Department (HOSPITAL_COMMUNITY): Payer: 59

## 2015-10-12 ENCOUNTER — Observation Stay (HOSPITAL_COMMUNITY)
Admission: AD | Admit: 2015-10-12 | Discharge: 2015-10-13 | Disposition: A | Discharge status: Home / Self Care | Payer: 59 | Source: Ambulatory Visit | Attending: Obstetrics and Gynecology | Admitting: Obstetrics and Gynecology

## 2015-10-12 DIAGNOSIS — M549 Dorsalgia, unspecified: Secondary | ICD-10-CM | POA: Diagnosis not present

## 2015-10-12 DIAGNOSIS — Z3A38 38 weeks gestation of pregnancy: Secondary | ICD-10-CM | POA: Insufficient documentation

## 2015-10-12 DIAGNOSIS — F17210 Nicotine dependence, cigarettes, uncomplicated: Secondary | ICD-10-CM | POA: Insufficient documentation

## 2015-10-12 DIAGNOSIS — O9989 Other specified diseases and conditions complicating pregnancy, childbirth and the puerperium: Secondary | ICD-10-CM | POA: Diagnosis not present

## 2015-10-12 DIAGNOSIS — O99333 Smoking (tobacco) complicating pregnancy, third trimester: Secondary | ICD-10-CM | POA: Insufficient documentation

## 2015-10-12 DIAGNOSIS — O479 False labor, unspecified: Secondary | ICD-10-CM

## 2015-10-12 DIAGNOSIS — R079 Chest pain, unspecified: Secondary | ICD-10-CM | POA: Diagnosis present

## 2015-10-12 DIAGNOSIS — R0602 Shortness of breath: Secondary | ICD-10-CM

## 2015-10-12 LAB — URINALYSIS, ROUTINE W REFLEX MICROSCOPIC
Bilirubin Urine: NEGATIVE
Glucose, UA: NEGATIVE mg/dL
Ketones, ur: NEGATIVE mg/dL
LEUKOCYTES UA: NEGATIVE
NITRITE: NEGATIVE
PH: 7 (ref 5.0–8.0)
Protein, ur: NEGATIVE mg/dL
SPECIFIC GRAVITY, URINE: 1.023 (ref 1.005–1.030)
UROBILINOGEN UA: 1 mg/dL (ref 0.0–1.0)

## 2015-10-12 LAB — BASIC METABOLIC PANEL
Anion gap: 10 (ref 5–15)
CHLORIDE: 106 mmol/L (ref 101–111)
CO2: 23 mmol/L (ref 22–32)
CREATININE: 0.7 mg/dL (ref 0.44–1.00)
Calcium: 8.8 mg/dL — ABNORMAL LOW (ref 8.9–10.3)
GFR calc Af Amer: >60 mL/min (ref >60)
GFR calc non Af Amer: >60 mL/min (ref >60)
GLUCOSE: 80 mg/dL (ref 65–99)
POTASSIUM: 3 mmol/L — AB (ref 3.5–5.1)
Sodium: 139 mmol/L (ref 135–145)

## 2015-10-12 LAB — CBC WITH DIFFERENTIAL/PLATELET
Basophils Absolute: 0 10*3/uL (ref 0.0–0.1)
Basophils Relative: 0 %
EOS ABS: 0.1 10*3/uL (ref 0.0–0.7)
EOS PCT: 1 %
HCT: 34.5 % — ABNORMAL LOW (ref 36.0–46.0)
HEMOGLOBIN: 11.8 g/dL — AB (ref 12.0–15.0)
LYMPHS ABS: 3.8 10*3/uL (ref 0.7–4.0)
LYMPHS PCT: 25 %
MCH: 31.3 pg (ref 26.0–34.0)
MCHC: 34.2 g/dL (ref 30.0–36.0)
MCV: 91.5 fL (ref 78.0–100.0)
MONOS PCT: 8 %
Monocytes Absolute: 1.2 10*3/uL — ABNORMAL HIGH (ref 0.1–1.0)
NEUTROS PCT: 66 %
Neutro Abs: 10.1 10*3/uL — ABNORMAL HIGH (ref 1.7–7.7)
Platelets: 227 10*3/uL (ref 150–400)
RBC: 3.77 MIL/uL — AB (ref 3.87–5.11)
RDW: 14.3 % (ref 11.5–15.5)
WBC: 15.2 10*3/uL — AB (ref 4.0–10.5)

## 2015-10-12 LAB — URINE MICROSCOPIC-ADD ON

## 2015-10-12 LAB — I-STAT TROPONIN, ED: TROPONIN I, POC: 0 ng/mL (ref 0.00–0.08)

## 2015-10-12 LAB — PROTIME-INR
INR: 0.99 (ref 0.00–1.49)
Prothrombin Time: 13.3 seconds (ref 11.6–15.2)

## 2015-10-12 LAB — I-STAT CREATININE, ED: CREATININE: 0.7 mg/dL (ref 0.44–1.00)

## 2015-10-12 MED ORDER — ONDANSETRON HCL 4 MG/2ML IJ SOLN
4.0000 mg | Freq: Once | INTRAMUSCULAR | Status: AC
  Administered 2015-10-12: 4 mg via INTRAVENOUS
  Filled 2015-10-12: qty 2

## 2015-10-12 MED ORDER — IOHEXOL 350 MG/ML SOLN
80.0000 mL | Freq: Once | INTRAVENOUS | Status: AC | PRN
  Administered 2015-10-12: 80 mL via INTRAVENOUS

## 2015-10-12 NOTE — ED Notes (Signed)
Patient here with complaint of sudden onset chest pain and shortness of breath. Currently [redacted]wks pregnant. Hx of spontaneous pneumothorax on right with surgical repair. States symptoms are similar to that incident. Breath sounds audible bilaterally, but diminished. Patient visibly in distress. Denies abdominal pain or contraction type pain.

## 2015-10-12 NOTE — ED Notes (Signed)
OB rapid response at bedside 
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
## 2015-10-13 ENCOUNTER — Encounter (HOSPITAL_COMMUNITY): Payer: Self-pay | Admitting: *Deleted

## 2015-10-13 LAB — LIPASE, BLOOD: Lipase: 21 U/L — ABNORMAL LOW (ref 22–51)

## 2015-10-13 LAB — TYPE AND SCREEN
ABO/RH(D): A POS
Antibody Screen: NEGATIVE

## 2015-10-13 LAB — ABO/RH: ABO/RH(D): A POS

## 2015-10-13 MED ORDER — PROMETHAZINE HCL 25 MG/ML IJ SOLN
12.5000 mg | Freq: Once | INTRAMUSCULAR | Status: AC
  Administered 2015-10-13: 12.5 mg via INTRAVENOUS
  Filled 2015-10-13: qty 1

## 2015-10-13 MED ORDER — FAMOTIDINE IN NACL 20-0.9 MG/50ML-% IV SOLN
20.0000 mg | Freq: Once | INTRAVENOUS | Status: DC | PRN
  Filled 2015-10-13: qty 50

## 2015-10-13 MED ORDER — PANTOPRAZOLE SODIUM 40 MG PO TBEC: 40.0000 mg | DELAYED_RELEASE_TABLET | Freq: Every day | ORAL | Status: DC

## 2015-10-13 MED ORDER — CITRIC ACID-SODIUM CITRATE 334-500 MG/5ML PO SOLN: 30.0000 mL | Freq: Once | ORAL | Status: DC | PRN

## 2015-10-13 MED ORDER — LACTATED RINGERS IV SOLN
INTRAVENOUS | Status: DC
  Administered 2015-10-13 (×2): via INTRAVENOUS

## 2015-10-13 NOTE — Progress Notes (Signed)
Patient ID: [name], female   DOB: 04/09/1986, 29 y.o.   MRN: [id number] Pt feeling much better this AM.  N/V resolved with phenergan and pepcid.  Not clear what chest pain was from, GERD? afeb vss FHR category 1 Intermittent mild ctx Cervix 2/60/-3  Pt cleared for d/c home. Will send in script for protonix Has appointment with Dr. Ambrose MantleHenley 10/15/15 and IOL 10/19/15
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
## 2015-10-13 NOTE — Discharge Instructions (Signed)
Braxton Hicks Contractions °Contractions of the uterus can occur throughout pregnancy. Contractions are not always a sign that you are in labor.  °WHAT ARE BRAXTON HICKS CONTRACTIONS?  °Contractions that occur before labor are called Braxton Hicks contractions, or false labor. Toward the end of pregnancy (32-34 weeks), these contractions can develop more often and may become more forceful. This is not true labor because these contractions do not result in opening (dilatation) and thinning of the cervix. They are sometimes difficult to tell apart from true labor because these contractions can be forceful and people have different pain tolerances. You should not feel embarrassed if you go to the hospital with false labor. Sometimes, the only way to tell if you are in true labor is for your health care provider to look for changes in the cervix. °If there are no prenatal problems or other health problems associated with the pregnancy, it is completely safe to be sent home with false labor and await the onset of true labor. °HOW CAN YOU TELL THE DIFFERENCE BETWEEN TRUE AND FALSE LABOR? °False Labor °· The contractions of false labor are usually shorter and not as hard as those of true labor.   °· The contractions are usually irregular.   °· The contractions are often felt in the front of the lower abdomen and in the groin.   °· The contractions may go away when you walk around or change positions while lying down.   °· The contractions get weaker and are shorter lasting as time goes on.   °· The contractions do not usually become progressively stronger, regular, and closer together as with true labor.   °True Labor °· Contractions in true labor last 30-70 seconds, become very regular, usually become more intense, and increase in frequency.   °· The contractions do not go away with walking.   °· The discomfort is usually felt in the top of the uterus and spreads to the lower abdomen and low back.   °· True labor can be  determined by your health care provider with an exam. This will show that the cervix is dilating and getting thinner.   °WHAT TO REMEMBER °· Keep up with your usual exercises and follow other instructions given by your health care provider.   °· Take medicines as directed by your health care provider.   °· Keep your regular prenatal appointments.   °· Eat and drink lightly if you think you are going into labor.   °· If Braxton Hicks contractions are making you uncomfortable:   °¨ Change your position from lying down or resting to walking, or from walking to resting.   °¨ Sit and rest in a tub of warm water.   °¨ Drink 2-3 glasses of water. Dehydration may cause these contractions.   °¨ Do slow and deep breathing several times an hour.   °WHEN SHOULD I SEEK IMMEDIATE MEDICAL CARE? °Seek immediate medical care if: °· Your contractions become stronger, more regular, and closer together.   °· You have fluid leaking or gushing from your vagina.   °· You have a fever.   °· You pass blood-tinged mucus.   °· You have vaginal bleeding.   °· You have continuous abdominal pain.   °· You have low back pain that you never had before.   °· You feel your baby's head pushing down and causing pelvic pressure.   °· Your baby is not moving as much as it used to.   °  °This information is not intended to replace advice given to you by your health care provider. Make sure you discuss any questions you have with your health care   provider. °  °Document Released: 12/15/2005 Document Revised: 12/20/2013 Document Reviewed: 09/26/2013 °Elsevier Interactive Patient Education ©2016 [employer] ° °

## 2015-10-13 NOTE — Discharge Summary (Addendum)
  Brief Discharge Summary  Discharge Diagnosis Severe chest pain of unclear etiology Term pregnancy at 38 6/7 weeks Contractions  Studies  CXR CT scan of chest  Pt was admitted to observation [facility] severe [facility] pain/N/V and had a negative w/u for recurrent pneumothorax and PE. She was having some contractions and pain not totally resolved after w/u so [facility]. Her chest pain improved with pepcid and Phenergan and zofran resolved her nausea.  Despite mild intermittent contractions her cervix did not change at all.  2-3/60/-3 on discharge.  FHR was category 1 throughout her stay.  Follow up Dr Ambrose MantleHenley 10/15/15

## 2015-10-14 LAB — URINE CULTURE

## 2015-10-16 ENCOUNTER — Telehealth (HOSPITAL_COMMUNITY): Payer: Self-pay | Admitting: *Deleted

## 2015-10-16 NOTE — Telephone Encounter (Signed)
Preadmission screen  

## 2015-10-18 ENCOUNTER — Other Ambulatory Visit: Payer: Self-pay | Admitting: Obstetrics and Gynecology

## 2015-10-18 NOTE — Progress Notes (Signed)
Spoke with Dr. Andreas NewportHenleys office regarding need for IOL orders before 5pm. Secretary stated she would give him the message now.

## 2015-10-19 ENCOUNTER — Inpatient Hospital Stay (HOSPITAL_COMMUNITY)
Admission: RE | Admit: 2015-10-19 | Discharge: 2015-10-21 | DRG: 775 | Disposition: A | Discharge status: Home / Self Care | Payer: 59 | Source: Ambulatory Visit | Attending: Obstetrics and Gynecology | Admitting: Obstetrics and Gynecology

## 2015-10-19 ENCOUNTER — Inpatient Hospital Stay (HOSPITAL_COMMUNITY): Payer: 59 | Admitting: Anesthesiology

## 2015-10-19 ENCOUNTER — Encounter (HOSPITAL_COMMUNITY): Payer: Self-pay

## 2015-10-19 VITALS — BP 127/69 | HR 61 | Temp 97.8°F | Resp 16 | Ht 63.0 in | Wt 160.0 lb

## 2015-10-19 DIAGNOSIS — J9383 Other pneumothorax: Secondary | ICD-10-CM

## 2015-10-19 DIAGNOSIS — Z8249 Family history of ischemic heart disease and other diseases of the circulatory system: Secondary | ICD-10-CM

## 2015-10-19 DIAGNOSIS — Z87891 Personal history of nicotine dependence: Secondary | ICD-10-CM

## 2015-10-19 DIAGNOSIS — Z3490 Encounter for supervision of normal pregnancy, unspecified, unspecified trimester: Secondary | ICD-10-CM

## 2015-10-19 DIAGNOSIS — J939 Pneumothorax, unspecified: Secondary | ICD-10-CM

## 2015-10-19 DIAGNOSIS — Z3A39 39 weeks gestation of pregnancy: Secondary | ICD-10-CM

## 2015-10-19 DIAGNOSIS — E876 Hypokalemia: Secondary | ICD-10-CM

## 2015-10-19 LAB — CBC
HEMATOCRIT: 32.6 % — AB (ref 36.0–46.0)
Hemoglobin: 11.1 g/dL — ABNORMAL LOW (ref 12.0–15.0)
MCH: 30.7 pg (ref 26.0–34.0)
MCHC: 34 g/dL (ref 30.0–36.0)
MCV: 90.3 fL (ref 78.0–100.0)
PLATELETS: 271 10*3/uL (ref 150–400)
RBC: 3.61 MIL/uL — ABNORMAL LOW (ref 3.87–5.11)
RDW: 14.7 % (ref 11.5–15.5)
WBC: 15.2 10*3/uL — AB (ref 4.0–10.5)

## 2015-10-19 LAB — TYPE AND SCREEN
ABO/RH(D): A POS
Antibody Screen: NEGATIVE

## 2015-10-19 LAB — RPR: RPR Ser Ql: NONREACTIVE

## 2015-10-19 MED ORDER — FENTANYL 2.5 MCG/ML BUPIVACAINE 1/10 % EPIDURAL INFUSION (WH - ANES)
14.0000 mL/h | INTRAMUSCULAR | Status: DC | PRN
  Administered 2015-10-19: 14 mL/h via EPIDURAL
  Administered 2015-10-19: 13.5 mL/h via EPIDURAL
  Filled 2015-10-19: qty 125

## 2015-10-19 MED ORDER — ACETAMINOPHEN 325 MG PO TABS: 650.0000 mg | ORAL_TABLET | ORAL | Status: DC | PRN

## 2015-10-19 MED ORDER — SENNOSIDES-DOCUSATE SODIUM 8.6-50 MG PO TABS
2.0000 | ORAL_TABLET | ORAL | Status: DC
  Administered 2015-10-20 (×2): 2 via ORAL
  Filled 2015-10-19 (×2): qty 2

## 2015-10-19 MED ORDER — OXYTOCIN 40 UNITS IN LACTATED RINGERS INFUSION - SIMPLE MED: 62.5000 mL/h | INTRAVENOUS | Status: AC

## 2015-10-19 MED ORDER — ONDANSETRON HCL 4 MG/2ML IJ SOLN: 4.0000 mg | Freq: Four times a day (QID) | INTRAMUSCULAR | Status: DC | PRN

## 2015-10-19 MED ORDER — LANOLIN HYDROUS EX OINT
TOPICAL_OINTMENT | CUTANEOUS | Status: DC | PRN
Start: 2015-10-19 — End: 2015-10-21

## 2015-10-19 MED ORDER — LACTATED RINGERS IV SOLN
500.0000 mL | INTRAVENOUS | Status: DC | PRN
  Administered 2015-10-19: 500 mL via INTRAVENOUS

## 2015-10-19 MED ORDER — DOCUSATE SODIUM 100 MG PO CAPS
100.0000 mg | ORAL_CAPSULE | Freq: Every day | ORAL | Status: DC
Start: 2015-10-19 — End: 2015-10-21
  Administered 2015-10-19: 100 mg via ORAL
  Filled 2015-10-19: qty 1

## 2015-10-19 MED ORDER — CYCLOBENZAPRINE HCL 10 MG PO TABS
20.0000 mg | ORAL_TABLET | Freq: Every day | ORAL | Status: DC
  Administered 2015-10-19: 20 mg via ORAL
  Filled 2015-10-19 (×2): qty 2

## 2015-10-19 MED ORDER — OXYTOCIN BOLUS FROM INFUSION
500.0000 mL | INTRAVENOUS | Status: DC
  Administered 2015-10-19: 500 mL via INTRAVENOUS

## 2015-10-19 MED ORDER — BENZOCAINE-MENTHOL 20-0.5 % EX AERO
1.0000 "application " | INHALATION_SPRAY | CUTANEOUS | Status: DC | PRN
  Administered 2015-10-20: 1 via TOPICAL
  Filled 2015-10-19: qty 56

## 2015-10-19 MED ORDER — PRENATAL MULTIVITAMIN CH: 1.0000 | ORAL_TABLET | Freq: Every day | ORAL | Status: DC

## 2015-10-19 MED ORDER — SIMETHICONE 80 MG PO CHEW: 80.0000 mg | CHEWABLE_TABLET | ORAL | Status: DC | PRN

## 2015-10-19 MED ORDER — WITCH HAZEL-GLYCERIN EX PADS: 1.0000 "application " | MEDICATED_PAD | CUTANEOUS | Status: DC | PRN

## 2015-10-19 MED ORDER — POTASSIUM CHLORIDE 20 MEQ PO PACK: 20.0000 meq | PACK | Freq: Two times a day (BID) | ORAL | Status: DC

## 2015-10-19 MED ORDER — OXYTOCIN 40 UNITS IN LACTATED RINGERS INFUSION - SIMPLE MED
1.0000 m[IU]/min | INTRAVENOUS | Status: DC
  Administered 2015-10-19: 1 m[IU]/min via INTRAVENOUS
  Filled 2015-10-19: qty 1000

## 2015-10-19 MED ORDER — TETANUS-DIPHTH-ACELL PERTUSSIS 5-2.5-18.5 LF-MCG/0.5 IM SUSP
0.5000 mL | Freq: Once | INTRAMUSCULAR | Status: DC
Start: 2015-10-20 — End: 2015-10-21

## 2015-10-19 MED ORDER — LACTATED RINGERS IV SOLN: INTRAVENOUS | Status: AC

## 2015-10-19 MED ORDER — LIDOCAINE HCL (PF) 1 % IJ SOLN
INTRAMUSCULAR | Status: DC | PRN
  Administered 2015-10-19 (×2): 4 mL via EPIDURAL

## 2015-10-19 MED ORDER — MEASLES, MUMPS & RUBELLA VAC ~~LOC~~ INJ
0.5000 mL | INJECTION | Freq: Once | SUBCUTANEOUS | Status: DC
Start: 2015-10-20 — End: 2015-10-21

## 2015-10-19 MED ORDER — OXYTOCIN 40 UNITS IN LACTATED RINGERS INFUSION - SIMPLE MED
62.5000 mL/h | INTRAVENOUS | Status: DC
Start: 2015-10-19 — End: 2015-10-19

## 2015-10-19 MED ORDER — FERROUS SULFATE 325 (65 FE) MG PO TABS
325.0000 mg | ORAL_TABLET | Freq: Two times a day (BID) | ORAL | Status: DC
  Administered 2015-10-20 – 2015-10-21 (×3): 325 mg via ORAL
  Filled 2015-10-19 (×3): qty 1

## 2015-10-19 MED ORDER — PHENYLEPHRINE 40 MCG/ML (10ML) SYRINGE FOR IV PUSH (FOR BLOOD PRESSURE SUPPORT)
80.0000 ug | PREFILLED_SYRINGE | INTRAVENOUS | Status: DC | PRN
Start: 2015-10-19 — End: 2015-10-19
  Filled 2015-10-19: qty 2
  Filled 2015-10-19: qty 20

## 2015-10-19 MED ORDER — CYCLOBENZAPRINE HCL 10 MG PO TABS
20.0000 mg | ORAL_TABLET | Freq: Every day | ORAL | Status: DC
  Filled 2015-10-19: qty 2

## 2015-10-19 MED ORDER — OXYCODONE-ACETAMINOPHEN 5-325 MG PO TABS: 1.0000 | ORAL_TABLET | ORAL | Status: DC | PRN

## 2015-10-19 MED ORDER — OXYCODONE-ACETAMINOPHEN 5-325 MG PO TABS
1.0000 | ORAL_TABLET | ORAL | Status: DC | PRN
  Administered 2015-10-20 (×3): 1 via ORAL
  Filled 2015-10-19 (×2): qty 1

## 2015-10-19 MED ORDER — OXYCODONE-ACETAMINOPHEN 5-325 MG PO TABS: 2.0000 | ORAL_TABLET | ORAL | Status: DC | PRN

## 2015-10-19 MED ORDER — DIBUCAINE 1 % RE OINT: 1.0000 "application " | TOPICAL_OINTMENT | RECTAL | Status: DC | PRN

## 2015-10-19 MED ORDER — CITRIC ACID-SODIUM CITRATE 334-500 MG/5ML PO SOLN: 30.0000 mL | ORAL | Status: DC | PRN

## 2015-10-19 MED ORDER — ONDANSETRON HCL 4 MG/2ML IJ SOLN: 4.0000 mg | INTRAMUSCULAR | Status: DC | PRN

## 2015-10-19 MED ORDER — ONDANSETRON HCL 4 MG PO TABS: 4.0000 mg | ORAL_TABLET | ORAL | Status: DC | PRN

## 2015-10-19 MED ORDER — EPHEDRINE 5 MG/ML INJ
10.0000 mg | INTRAVENOUS | Status: DC | PRN
Start: 2015-10-19 — End: 2015-10-19
  Filled 2015-10-19: qty 2

## 2015-10-19 MED ORDER — LIDOCAINE HCL (PF) 1 % IJ SOLN
30.0000 mL | INTRAMUSCULAR | Status: DC | PRN
  Administered 2015-10-19: 30 mL via SUBCUTANEOUS
  Filled 2015-10-19: qty 30

## 2015-10-19 MED ORDER — LACTATED RINGERS IV SOLN
INTRAVENOUS | Status: DC
  Administered 2015-10-19 (×2): via INTRAVENOUS

## 2015-10-19 MED ORDER — POTASSIUM CHLORIDE CRYS ER 20 MEQ PO TBCR
20.0000 meq | EXTENDED_RELEASE_TABLET | Freq: Two times a day (BID) | ORAL | Status: DC
  Administered 2015-10-20: 20 meq via ORAL
  Filled 2015-10-19 (×3): qty 1

## 2015-10-19 MED ORDER — ACETAMINOPHEN 325 MG PO TABS
650.0000 mg | ORAL_TABLET | ORAL | Status: DC | PRN
  Administered 2015-10-19 – 2015-10-20 (×2): 650 mg via ORAL
  Filled 2015-10-19 (×2): qty 2

## 2015-10-19 MED ORDER — OXYCODONE-ACETAMINOPHEN 5-325 MG PO TABS
2.0000 | ORAL_TABLET | ORAL | Status: DC | PRN
  Administered 2015-10-21 (×2): 2 via ORAL
  Filled 2015-10-19 (×2): qty 2

## 2015-10-19 MED ORDER — ZOLPIDEM TARTRATE 5 MG PO TABS: 5.0000 mg | ORAL_TABLET | Freq: Every evening | ORAL | Status: DC | PRN

## 2015-10-19 MED ORDER — DIPHENHYDRAMINE HCL 25 MG PO CAPS: 25.0000 mg | ORAL_CAPSULE | Freq: Four times a day (QID) | ORAL | Status: DC | PRN

## 2015-10-19 MED ORDER — DIPHENHYDRAMINE HCL 50 MG/ML IJ SOLN: 12.5000 mg | INTRAMUSCULAR | Status: DC | PRN

## 2015-10-19 NOTE — Plan of Care (Signed)
Problem: Consults Goal: Birthing Suites Patient Information Press F2 to bring up selections list Outcome: Completed/Met Date Met:  10/19/15  Pt 37-[redacted] weeks EGA

## 2015-10-19 NOTE — Anesthesia Procedure Notes (Signed)
Epidural Patient location during procedure: OB Start time: 10/19/2015 11:52 AM  Staffing Anesthesiologist: Mal AmabileFOSTER, Ruvim Risko Performed by: anesthesiologist   Preanesthetic Checklist Completed: patient identified, site marked, surgical consent, pre-op evaluation, timeout performed, IV checked, risks and benefits discussed and monitors and equipment checked  Epidural Patient position: sitting Prep: site prepped and draped and DuraPrep Patient monitoring: continuous pulse ox and blood pressure Approach: midline Location: L3-L4 Injection technique: LOR air  Needle:  Needle type: Tuohy  Needle gauge: 17 G Needle length: 9 cm and 9 Needle insertion depth: 4 cm Catheter type: closed end flexible Catheter size: 19 Gauge Catheter at skin depth: 9 cm Test dose: negative and Other  Assessment Events: blood not aspirated, injection not painful, no injection resistance, negative IV test and no paresthesia  Additional Notes Patient identified. Risks and benefits discussed including failed block, incomplete  Pain control, post dural puncture headache, nerve damage, paralysis, blood pressure Changes, nausea, vomiting, reactions to medications-both toxic and allergic and post Partum back pain. All questions were answered. Patient expressed understanding and wished to proceed. Sterile technique was used throughout procedure. Epidural site was Dressed with sterile barrier dressing. No paresthesias, signs of intravascular injection Or signs of intrathecal spread were encountered.  Patient was more comfortable after the epidural was dosed. Please see RN's note for documentation of vital signs and FHR which are stable.

## 2015-10-19 NOTE — Progress Notes (Signed)
Patient ID: [name] DeterSherrie M [name], female   DOB: 01/31/1986, 29 y.o.   MRN: [id number] Delivery note:  The pt progressed to full dilatation with the vertex LOA and she pushed well to deliver a living female infant with Apgars of 9 and 9 at 1 and 5 minutes. The placenta delivered intact and the uterus was normal. The perineum was intact and she had bilateral labial lacerations L>R The left was bleeding and repaired with 3-0 vicryl. The right was hemostatic and not repaired. EBL 426 cc's Local 1% xylocaine used for repair

## 2015-10-19 NOTE — Progress Notes (Signed)
Patient ID: [name], female   DOB: 10/09/1986, 29 y.o.   MRN: [id number] Pt admitted for induction. Cervix 2 cm long vertex high by RN exam. Pitocin has started FHR normal.
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
## 2015-10-19 NOTE — Progress Notes (Signed)
Patient ID: [name] DeterSherrie M Kuehne, female   DOB: 12/03/1986, 29 y.o.   MRN: [id number] Pt received an epidural because  She was in pain and cervix was 4 cm. She is on 677mu/minute and the contractions are q 2-3 minutes The cervix is 4-5 cm 70 % effaced and the vertex is at - 3 station.

## 2015-10-19 NOTE — Anesthesia Preprocedure Evaluation (Signed)
Anesthesia Evaluation  Patient identified by MRN, date of birth, ID band Patient awake    Reviewed: Allergy & Precautions, NPO status , Patient's Chart, lab work & pertinent test results  Airway Mallampati: II  TM Distance: >3 FB Neck ROM: Full    Dental no notable dental hx. (+) Teeth Intact   Pulmonary former smoker,    Pulmonary exam normal breath sounds clear to auscultation       Cardiovascular negative cardio ROS Normal cardiovascular exam Rhythm:Regular Rate:Normal     Neuro/Psych  Headaches, PSYCHIATRIC DISORDERS Anxiety Depression PTSD   GI/Hepatic negative GI ROS, Neg liver ROS,   Endo/Other  negative endocrine ROS  Renal/GU negative Renal ROS  negative genitourinary   Musculoskeletal negative musculoskeletal ROS (+)   Abdominal   Peds  Hematology  (+) anemia ,   Anesthesia Other Findings   Reproductive/Obstetrics (+) Pregnancy                             Anesthesia Physical Anesthesia Plan  ASA: II  Anesthesia Plan: Epidural   Post-op Pain Management:    Induction:   Airway Management Planned: Natural Airway  Additional Equipment:   Intra-op Plan:   Post-operative Plan:   Informed Consent: I have reviewed the patients History and Physical, chart, labs and discussed the procedure including the risks, benefits and alternatives for the proposed anesthesia with the patient or authorized representative who has indicated his/her understanding and acceptance.     Plan Discussed with: Anesthesiologist  Anesthesia Plan Comments:         Anesthesia Quick Evaluation
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
## 2015-10-20 LAB — CBC
HCT: 28.9 % — ABNORMAL LOW (ref 36.0–46.0)
HEMOGLOBIN: 10 g/dL — AB (ref 12.0–15.0)
MCH: 31.4 pg (ref 26.0–34.0)
MCHC: 34.6 g/dL (ref 30.0–36.0)
MCV: 90.9 fL (ref 78.0–100.0)
Platelets: 223 10*3/uL (ref 150–400)
RBC: 3.18 MIL/uL — AB (ref 3.87–5.11)
RDW: 14.7 % (ref 11.5–15.5)
WBC: 15.9 10*3/uL — ABNORMAL HIGH (ref 4.0–10.5)

## 2015-10-20 LAB — COMPREHENSIVE METABOLIC PANEL
ALBUMIN: 2.3 g/dL — AB (ref 3.5–5.0)
ALK PHOS: 160 U/L — AB (ref 38–126)
ALT: 17 U/L (ref 14–54)
AST: 26 U/L (ref 15–41)
Anion gap: 5 (ref 5–15)
BUN: <5 mg/dL — ABNORMAL LOW (ref 6–20)
CHLORIDE: 108 mmol/L (ref 101–111)
CO2: 24 mmol/L (ref 22–32)
Calcium: 8.2 mg/dL — ABNORMAL LOW (ref 8.9–10.3)
Creatinine, Ser: 0.45 mg/dL (ref 0.44–1.00)
GFR calc Af Amer: >60 mL/min (ref >60)
GFR calc non Af Amer: >60 mL/min (ref >60)
Glucose, Bld: 93 mg/dL (ref 65–99)
Potassium: 2.5 mmol/L — CL (ref 3.5–5.1)
SODIUM: 137 mmol/L (ref 135–145)
Total Bilirubin: 0.3 mg/dL (ref 0.3–1.2)
Total Protein: 4.8 g/dL — ABNORMAL LOW (ref 6.5–8.1)

## 2015-10-20 NOTE — Anesthesia Postprocedure Evaluation (Signed)
  Anesthesia Post-op Note  Patient: [name] [name]  Procedure(s) Performed: * No procedures listed *  Patient Location: Mother/Baby  Anesthesia Type:Epidural  Level of Consciousness: awake, alert  and oriented  Airway and Oxygen Therapy: Patient Spontanous Breathing  Post-op Pain: none  Post-op Assessment: Post-op Vital signs reviewed and Patient's Cardiovascular Status Stable              Post-op Vital Signs: Reviewed and stable  Last Vitals:  Filed Vitals:   10/20/15 0545  BP: 125/64  Pulse: 81  Temp: 36.8 C  Resp: 20    Complications: No apparent anesthesia complications
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
## 2015-10-20 NOTE — Progress Notes (Signed)
Patient ID: [name] [name], female   DOB: 06/11/1986, 29 y.o.   MRN: [id number] #1 afebrile BP normal HGB 11.1 to 10.0

## 2015-10-20 NOTE — Lactation Note (Signed)
This note was copied from the chart of [name]. Lactation Consultation Note  Experienced BF mother reports that BF is going well.  SHe denies any concerns and is able to hand express.  Aware of support group and outpatient services.  Patient Name: Boy [name] [name] ZOXWR'UToday's Date: 10/20/2015 Reason for consult: Initial assessment   Maternal Data Has patient been taught Hand Expression?: Yes Does the patient have breastfeeding experience prior to this delivery?: Yes  Feeding Feeding Type: Breast Fed Length of feed: 15 min  LATCH Score/Interventions                      Lactation Tools Discussed/Used     Consult Status      Soyla DryerJoseph, Layal Javid 10/20/2015, 2:52 PM

## 2015-10-21 MED ORDER — OXYCODONE-ACETAMINOPHEN 5-325 MG PO TABS: 1.0000 | ORAL_TABLET | Freq: Four times a day (QID) | ORAL | Status: DC | PRN

## 2015-10-21 MED ORDER — POTASSIUM CHLORIDE 20 MEQ PO PACK: 20.0000 meq | PACK | Freq: Two times a day (BID) | ORAL | Status: DC

## 2015-10-21 NOTE — Progress Notes (Signed)
Patient ID: [name], female   DOB: 02/21/1986, 29 y.o.   MRN: [id number] #2 afebrile BP normal for d/c

## 2015-10-21 NOTE — Discharge Summary (Signed)
NAMJoni [name]:  Grandstaff, Arleene            ACCOUNT NO.:  [id number]  MEDICAL RECORD NO.:  [id number]  LOCATION:  9123                          [facility]ILITY:  [facility]  PHYSICIAN:  Malachi Prohomas F. Ambrose MantleHenley, M.D. DATE OF BIRTH:  January 18, 1986  DATE OF ADMISSION:  10/19/2015 DATE OF DISCHARGE:  10/21/2015                              DISCHARGE SUMMARY   HISTORY OF PRESENT ILLNESS:  A 29 year old white female, para 3-0-6-1, gravida 10 admitted for induction of labor.  On admission, the cervix was 2 cm, 30%, vertex at a-3.  She began to have good contractions and received an epidural.  At 12:57 p.m., the cervix was 4-5 cm, 70%, vertex at a -3.  She progressed to full dilatation with the vertex LOA, and she pushed well to deliver a living female infant.  Apgars of 9 and 9 at 1 and 5 minutes.  Weight was 8 pounds 7 ounces.  Placenta delivered intact. Uterus normal.  Perineum intact, bilateral labial lacerations, left one was sutured because of bleeding.  Blood loss about 426 mL.  Postpartum, the patient did very well, was discharged on the second postpartum day.  FINAL DIAGNOSES:  Intrauterine pregnancy at 39+ weeks, delivered left occiput anterior.  OPERATION:  Spontaneous delivery, left occiput anterior, repair of left labial laceration.  FINAL CONDITION:  Improved.  INSTRUCTIONS:  Include our regular discharge instruction booklet as well as the after visit summary.  Prescription for Percocet 5/325, 30 tablets, 1 every 6 hours as needed for pain, also Klor-Con 20 mEq twice a day.  Her potassium level was still low.  Her initial hemoglobin on admission was 11.1, hematocrit 32.6, white count 15,200, platelet count 271,000.  Followup hemoglobin was 10.0, hematocrit 28.9, potassium was 2.5.  FINAL INSTRUCTIONS:  Include our discharge instruction booklet as well as the after visit summary, return to the office in 6 weeks for followup examination.     Malachi Prohomas F. Ambrose MantleHenley, M.D.     TFH/MEDQ  D:  10/21/2015  T:   10/21/2015  Job:  [id number]

## 2015-10-21 NOTE — Discharge Instructions (Signed)
booklet °

## 2015-12-18 ENCOUNTER — Emergency Department (HOSPITAL_COMMUNITY): Payer: Medicaid Other

## 2015-12-18 ENCOUNTER — Observation Stay (HOSPITAL_COMMUNITY)
Admission: EM | Admit: 2015-12-18 | Discharge: 2015-12-19 | Disposition: A | Discharge status: Home / Self Care | Payer: Medicaid Other | Attending: Surgery | Admitting: Surgery

## 2015-12-18 ENCOUNTER — Encounter (HOSPITAL_COMMUNITY): Payer: Self-pay | Admitting: Emergency Medicine

## 2015-12-18 DIAGNOSIS — K8012 Calculus of gallbladder with acute and chronic cholecystitis without obstruction: Principal | ICD-10-CM | POA: Insufficient documentation

## 2015-12-18 DIAGNOSIS — Z888 Allergy status to other drugs, medicaments and biological substances status: Secondary | ICD-10-CM | POA: Diagnosis not present

## 2015-12-18 DIAGNOSIS — R0602 Shortness of breath: Secondary | ICD-10-CM | POA: Insufficient documentation

## 2015-12-18 DIAGNOSIS — F419 Anxiety disorder, unspecified: Secondary | ICD-10-CM | POA: Diagnosis not present

## 2015-12-18 DIAGNOSIS — K819 Cholecystitis, unspecified: Secondary | ICD-10-CM

## 2015-12-18 DIAGNOSIS — Z87891 Personal history of nicotine dependence: Secondary | ICD-10-CM | POA: Insufficient documentation

## 2015-12-18 DIAGNOSIS — D649 Anemia, unspecified: Secondary | ICD-10-CM | POA: Insufficient documentation

## 2015-12-18 DIAGNOSIS — F4310 Post-traumatic stress disorder, unspecified: Secondary | ICD-10-CM | POA: Diagnosis not present

## 2015-12-18 DIAGNOSIS — R1031 Right lower quadrant pain: Secondary | ICD-10-CM | POA: Diagnosis present

## 2015-12-18 DIAGNOSIS — F329 Major depressive disorder, single episode, unspecified: Secondary | ICD-10-CM | POA: Diagnosis not present

## 2015-12-18 DIAGNOSIS — K810 Acute cholecystitis: Secondary | ICD-10-CM

## 2015-12-18 LAB — URINE MICROSCOPIC-ADD ON

## 2015-12-18 LAB — CBC
HEMATOCRIT: 42.3 % (ref 36.0–46.0)
HEMATOCRIT: 42.3 % (ref 36.0–46.0)
HEMOGLOBIN: 14 g/dL (ref 12.0–15.0)
HEMOGLOBIN: 14.5 g/dL (ref 12.0–15.0)
MCH: 30 pg (ref 26.0–34.0)
MCH: 30.8 pg (ref 26.0–34.0)
MCHC: 33.1 g/dL (ref 30.0–36.0)
MCHC: 34.3 g/dL (ref 30.0–36.0)
MCV: 90.6 fL (ref 78.0–100.0)
MCV: 89.8 fL (ref 78.0–100.0)
Platelets: 252 10*3/uL (ref 150–400)
Platelets: 249 10*3/uL (ref 150–400)
RBC: 4.67 MIL/uL (ref 3.87–5.11)
RBC: 4.71 MIL/uL (ref 3.87–5.11)
RDW: 12.7 % (ref 11.5–15.5)
RDW: 12.6 % (ref 11.5–15.5)
WBC: 8 10*3/uL (ref 4.0–10.5)
WBC: 9.6 10*3/uL (ref 4.0–10.5)

## 2015-12-18 LAB — I-STAT BETA HCG BLOOD, ED (MC, WL, AP ONLY)

## 2015-12-18 LAB — COMPREHENSIVE METABOLIC PANEL
ALBUMIN: 4 g/dL (ref 3.5–5.0)
ALT: 24 U/L (ref 14–54)
ANION GAP: 9 (ref 5–15)
AST: 43 U/L — ABNORMAL HIGH (ref 15–41)
Alkaline Phosphatase: 64 U/L (ref 38–126)
BILIRUBIN TOTAL: 1.2 mg/dL (ref 0.3–1.2)
BUN: 9 mg/dL (ref 6–20)
CALCIUM: 9.3 mg/dL (ref 8.9–10.3)
CO2: 24 mmol/L (ref 22–32)
Chloride: 108 mmol/L (ref 101–111)
Creatinine, Ser: 0.92 mg/dL (ref 0.44–1.00)
GLUCOSE: 120 mg/dL — AB (ref 65–99)
POTASSIUM: 5 mmol/L (ref 3.5–5.1)
Sodium: 141 mmol/L (ref 135–145)
TOTAL PROTEIN: 5.9 g/dL — AB (ref 6.5–8.1)

## 2015-12-18 LAB — CREATININE, SERUM: Creatinine, Ser: 0.8 mg/dL (ref 0.44–1.00)

## 2015-12-18 LAB — URINALYSIS, ROUTINE W REFLEX MICROSCOPIC
Bilirubin Urine: NEGATIVE
Glucose, UA: NEGATIVE mg/dL
Ketones, ur: NEGATIVE mg/dL
LEUKOCYTES UA: NEGATIVE
NITRITE: NEGATIVE
PH: 8.5 — AB (ref 5.0–8.0)
Protein, ur: NEGATIVE mg/dL
SPECIFIC GRAVITY, URINE: 1.015 (ref 1.005–1.030)

## 2015-12-18 LAB — SURGICAL PCR SCREEN
MRSA, PCR: NEGATIVE
STAPHYLOCOCCUS AUREUS: NEGATIVE

## 2015-12-18 LAB — LIPASE, BLOOD: Lipase: 26 U/L (ref 11–51)

## 2015-12-18 MED ORDER — DIPHENHYDRAMINE HCL 25 MG PO CAPS: 25.0000 mg | ORAL_CAPSULE | Freq: Four times a day (QID) | ORAL | Status: DC | PRN

## 2015-12-18 MED ORDER — NICOTINE 14 MG/24HR TD PT24
14.0000 mg | MEDICATED_PATCH | TRANSDERMAL | Status: DC
  Administered 2015-12-18: 14 mg via TRANSDERMAL
  Filled 2015-12-18 (×2): qty 1

## 2015-12-18 MED ORDER — ONDANSETRON HCL 4 MG/2ML IJ SOLN
4.0000 mg | Freq: Once | INTRAMUSCULAR | Status: AC
  Administered 2015-12-18: 4 mg via INTRAVENOUS
  Filled 2015-12-18: qty 2

## 2015-12-18 MED ORDER — HYDROMORPHONE HCL 1 MG/ML IJ SOLN
1.0000 mg | Freq: Once | INTRAMUSCULAR | Status: AC
  Administered 2015-12-18: 1 mg via INTRAVENOUS
  Filled 2015-12-18: qty 1

## 2015-12-18 MED ORDER — DEXTROSE-NACL 5-0.9 % IV SOLN
INTRAVENOUS | Status: DC
  Administered 2015-12-18: 125 mL/h via INTRAVENOUS
  Administered 2015-12-19: 06:00:00 via INTRAVENOUS

## 2015-12-18 MED ORDER — LORAZEPAM 2 MG/ML IJ SOLN
0.2500 mg | Freq: Four times a day (QID) | INTRAMUSCULAR | Status: DC | PRN
  Administered 2015-12-18: 0.25 mg via INTRAVENOUS
  Filled 2015-12-18: qty 1

## 2015-12-18 MED ORDER — DIPHENHYDRAMINE HCL 50 MG/ML IJ SOLN: 25.0000 mg | Freq: Four times a day (QID) | INTRAMUSCULAR | Status: DC | PRN

## 2015-12-18 MED ORDER — DEXTROSE 5 % IV SOLN
2.0000 g | INTRAVENOUS | Status: DC
  Administered 2015-12-18 – 2015-12-19 (×2): 2 g via INTRAVENOUS
  Filled 2015-12-18 (×3): qty 2

## 2015-12-18 MED ORDER — HYDROMORPHONE HCL 1 MG/ML IJ SOLN
0.5000 mg | INTRAMUSCULAR | Status: DC | PRN
  Administered 2015-12-18 – 2015-12-19 (×4): 1 mg via INTRAVENOUS
  Filled 2015-12-18 (×4): qty 1

## 2015-12-18 MED ORDER — HEPARIN SODIUM (PORCINE) 5000 UNIT/ML IJ SOLN: 5000.0000 [IU] | Freq: Three times a day (TID) | INTRAMUSCULAR | Status: AC
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
## 2015-12-18 NOTE — ED Notes (Signed)
Pt from home via [facility] with c/o SOB and RUQ pain starting this am around 8.  Pt reports hx of feeling the same with a collapsed lung this past May.  EMS reports clear lungs throughout and tenderness to RUQ.  SpO2 RA 100%.  Pt denies N/V, EKG unremarkable, normal BM yesterday.  NAD, A&O.

## 2015-12-18 NOTE — ED Notes (Signed)
Patient transported to Ultrasound 

## 2015-12-18 NOTE — ED Notes (Signed)
Pt. Throwing up entire time in XR per transport.

## 2015-12-18 NOTE — ED Notes (Signed)
General surgery at bedside. 
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
## 2015-12-19 ENCOUNTER — Observation Stay (HOSPITAL_COMMUNITY): Payer: Medicaid Other | Admitting: Anesthesiology

## 2015-12-19 ENCOUNTER — Observation Stay (HOSPITAL_COMMUNITY): Payer: Medicaid Other

## 2015-12-19 ENCOUNTER — Encounter (HOSPITAL_COMMUNITY): Payer: Self-pay

## 2015-12-19 ENCOUNTER — Encounter (HOSPITAL_COMMUNITY)
Admission: EM | Disposition: A | Discharge status: Home / Self Care | Payer: Self-pay | Source: Home / Self Care | Attending: Emergency Medicine

## 2015-12-19 DIAGNOSIS — K8012 Calculus of gallbladder with acute and chronic cholecystitis without obstruction: Secondary | ICD-10-CM | POA: Diagnosis not present

## 2015-12-19 DIAGNOSIS — R0602 Shortness of breath: Secondary | ICD-10-CM | POA: Diagnosis not present

## 2015-12-19 DIAGNOSIS — F329 Major depressive disorder, single episode, unspecified: Secondary | ICD-10-CM | POA: Diagnosis not present

## 2015-12-19 DIAGNOSIS — F4310 Post-traumatic stress disorder, unspecified: Secondary | ICD-10-CM | POA: Diagnosis not present

## 2015-12-19 HISTORY — PX: CHOLECYSTECTOMY: SHX55

## 2015-12-19 LAB — LIPASE, BLOOD: LIPASE: 17 U/L (ref 11–51)

## 2015-12-19 LAB — CBC
HCT: 37.1 % (ref 36.0–46.0)
Hemoglobin: 12.4 g/dL (ref 12.0–15.0)
MCH: 30.5 pg (ref 26.0–34.0)
MCHC: 33.4 g/dL (ref 30.0–36.0)
MCV: 91.2 fL (ref 78.0–100.0)
Platelets: 206 10*3/uL (ref 150–400)
RBC: 4.07 MIL/uL (ref 3.87–5.11)
RDW: 12.8 % (ref 11.5–15.5)
WBC: 5.5 10*3/uL (ref 4.0–10.5)

## 2015-12-19 LAB — COMPREHENSIVE METABOLIC PANEL
ALBUMIN: 2.9 g/dL — AB (ref 3.5–5.0)
ALT: 38 U/L (ref 14–54)
AST: 32 U/L (ref 15–41)
Alkaline Phosphatase: 60 U/L (ref 38–126)
Anion gap: 7 (ref 5–15)
BILIRUBIN TOTAL: 0.4 mg/dL (ref 0.3–1.2)
BUN: 8 mg/dL (ref 6–20)
CALCIUM: 8.4 mg/dL — AB (ref 8.9–10.3)
CO2: 27 mmol/L (ref 22–32)
Chloride: 106 mmol/L (ref 101–111)
Creatinine, Ser: 0.82 mg/dL (ref 0.44–1.00)
GFR calc Af Amer: >60 mL/min (ref >60)
GFR calc non Af Amer: >60 mL/min (ref >60)
GLUCOSE: 112 mg/dL — AB (ref 65–99)
Potassium: 3.4 mmol/L — ABNORMAL LOW (ref 3.5–5.1)
Sodium: 140 mmol/L (ref 135–145)
TOTAL PROTEIN: 4.6 g/dL — AB (ref 6.5–8.1)

## 2015-12-19 SURGERY — LAPAROSCOPIC CHOLECYSTECTOMY WITH INTRAOPERATIVE CHOLANGIOGRAM
Anesthesia: General | Site: Abdomen

## 2015-12-19 MED ORDER — LIDOCAINE HCL (CARDIAC) 20 MG/ML IV SOLN
INTRAVENOUS | Status: AC
  Filled 2015-12-19: qty 5

## 2015-12-19 MED ORDER — FENTANYL CITRATE (PF) 250 MCG/5ML IJ SOLN
INTRAMUSCULAR | Status: AC
  Filled 2015-12-19: qty 5

## 2015-12-19 MED ORDER — HYDROMORPHONE HCL 1 MG/ML IJ SOLN
0.2500 mg | INTRAMUSCULAR | Status: DC | PRN
  Administered 2015-12-19 (×4): 0.5 mg via INTRAVENOUS

## 2015-12-19 MED ORDER — SUGAMMADEX SODIUM 200 MG/2ML IV SOLN
INTRAVENOUS | Status: DC | PRN
  Administered 2015-12-19: 115 mg via INTRAVENOUS

## 2015-12-19 MED ORDER — DEXTROSE-NACL 5-0.9 % IV SOLN: INTRAVENOUS | Status: DC

## 2015-12-19 MED ORDER — 0.9 % SODIUM CHLORIDE (POUR BTL) OPTIME
TOPICAL | Status: DC | PRN
Start: 2015-12-19 — End: 2015-12-19
  Administered 2015-12-19: 1000 mL

## 2015-12-19 MED ORDER — OXYCODONE-ACETAMINOPHEN 5-325 MG PO TABS: 1.0000 | ORAL_TABLET | ORAL | Status: DC | PRN

## 2015-12-19 MED ORDER — PROPOFOL 10 MG/ML IV BOLUS
INTRAVENOUS | Status: DC | PRN
  Administered 2015-12-19: 160 mg via INTRAVENOUS

## 2015-12-19 MED ORDER — DIPHENHYDRAMINE HCL 50 MG/ML IJ SOLN
INTRAMUSCULAR | Status: AC
  Filled 2015-12-19: qty 1

## 2015-12-19 MED ORDER — HYDROMORPHONE HCL 1 MG/ML IJ SOLN
INTRAMUSCULAR | Status: DC
Start: 2015-12-19 — End: 2015-12-19
  Filled 2015-12-19: qty 1

## 2015-12-19 MED ORDER — SUCCINYLCHOLINE CHLORIDE 20 MG/ML IJ SOLN
INTRAMUSCULAR | Status: DC | PRN
  Administered 2015-12-19: 100 mg via INTRAVENOUS

## 2015-12-19 MED ORDER — BUPIVACAINE-EPINEPHRINE 0.25% -1:200000 IJ SOLN
INTRAMUSCULAR | Status: DC | PRN
  Administered 2015-12-19: 30 mL

## 2015-12-19 MED ORDER — ROCURONIUM BROMIDE 100 MG/10ML IV SOLN
INTRAVENOUS | Status: DC | PRN
  Administered 2015-12-19: 30 mg via INTRAVENOUS

## 2015-12-19 MED ORDER — DEXAMETHASONE SODIUM PHOSPHATE 4 MG/ML IJ SOLN
INTRAMUSCULAR | Status: DC | PRN
  Administered 2015-12-19: 4 mg via INTRAVENOUS

## 2015-12-19 MED ORDER — LACTATED RINGERS IV SOLN
INTRAVENOUS | Status: DC
  Administered 2015-12-19: 13:00:00 via INTRAVENOUS

## 2015-12-19 MED ORDER — MIDAZOLAM HCL 5 MG/5ML IJ SOLN
INTRAMUSCULAR | Status: DC | PRN
  Administered 2015-12-19: 2 mg via INTRAVENOUS

## 2015-12-19 MED ORDER — LIDOCAINE HCL (CARDIAC) 20 MG/ML IV SOLN
INTRAVENOUS | Status: DC | PRN
  Administered 2015-12-19: 50 mg via INTRAVENOUS

## 2015-12-19 MED ORDER — MIDAZOLAM HCL 2 MG/2ML IJ SOLN: 0.5000 mg | Freq: Once | INTRAMUSCULAR | Status: DC | PRN

## 2015-12-19 MED ORDER — FENTANYL CITRATE (PF) 100 MCG/2ML IJ SOLN
INTRAMUSCULAR | Status: DC | PRN
  Administered 2015-12-19: 100 ug via INTRAVENOUS
  Administered 2015-12-19: 50 ug via INTRAVENOUS

## 2015-12-19 MED ORDER — MEPERIDINE HCL 25 MG/ML IJ SOLN: 6.2500 mg | INTRAMUSCULAR | Status: DC | PRN

## 2015-12-19 MED ORDER — MIDAZOLAM HCL 2 MG/2ML IJ SOLN
INTRAMUSCULAR | Status: AC
  Filled 2015-12-19: qty 2

## 2015-12-19 MED ORDER — ONDANSETRON HCL 4 MG/2ML IJ SOLN
INTRAMUSCULAR | Status: DC | PRN
  Administered 2015-12-19: 4 mg via INTRAVENOUS

## 2015-12-19 MED ORDER — SODIUM CHLORIDE 0.9 % IV SOLN
INTRAVENOUS | Status: DC | PRN
  Administered 2015-12-19: 12 mL

## 2015-12-19 MED ORDER — DIPHENHYDRAMINE HCL 50 MG/ML IJ SOLN
INTRAMUSCULAR | Status: DC | PRN
  Administered 2015-12-19: 12.5 mg via INTRAVENOUS

## 2015-12-19 MED ORDER — OXYCODONE-ACETAMINOPHEN 5-325 MG PO TABS
1.0000 | ORAL_TABLET | ORAL | Status: DC | PRN
  Administered 2015-12-19: 1 via ORAL
  Filled 2015-12-19: qty 1

## 2015-12-19 MED ORDER — SODIUM CHLORIDE 0.9 % IR SOLN
Status: DC | PRN
Start: 2015-12-19 — End: 2015-12-19
  Administered 2015-12-19: 1000 mL

## 2015-12-19 MED ORDER — PROMETHAZINE HCL 25 MG/ML IJ SOLN: 6.2500 mg | INTRAMUSCULAR | Status: DC | PRN

## 2015-12-19 MED ORDER — DEXAMETHASONE SODIUM PHOSPHATE 4 MG/ML IJ SOLN
INTRAMUSCULAR | Status: AC
  Filled 2015-12-19: qty 1

## 2015-12-19 MED ORDER — SUGAMMADEX SODIUM 200 MG/2ML IV SOLN
INTRAVENOUS | Status: AC
Start: 2015-12-19 — End: 2015-12-19
  Filled 2015-12-19: qty 2

## 2015-12-19 SURGICAL SUPPLY — 39 items
APPLIER CLIP ROT 10 11.4 M/L (STAPLE) ×2
BENZOIN TINCTURE PRP APPL 2/3 (GAUZE/BANDAGES/DRESSINGS) ×2 IMPLANT
CANISTER SUCTION 2500CC (MISCELLANEOUS) ×2 IMPLANT
CHLORAPREP W/TINT 26ML (MISCELLANEOUS) ×2 IMPLANT
CLIP APPLIE ROT 10 11.4 M/L (STAPLE) ×1 IMPLANT
COVER MAYO STAND STRL (DRAPES) ×2 IMPLANT
COVER SURGICAL LIGHT HANDLE (MISCELLANEOUS) ×2 IMPLANT
DRAPE C-ARM 42X72 X-RAY (DRAPES) ×2 IMPLANT
DRSG TEGADERM 2-3/8X2-3/4 SM (GAUZE/BANDAGES/DRESSINGS) ×6 IMPLANT
DRSG TEGADERM 4X4.75 (GAUZE/BANDAGES/DRESSINGS) ×2 IMPLANT
ELECT REM PT RETURN 9FT ADLT (ELECTROSURGICAL) ×2
ELECTRODE REM PT RTRN 9FT ADLT (ELECTROSURGICAL) ×1 IMPLANT
FILTER SMOKE EVAC LAPAROSHD (FILTER) ×2 IMPLANT
GAUZE SPONGE 2X2 8PLY STRL LF (GAUZE/BANDAGES/DRESSINGS) ×1 IMPLANT
GLOVE BIO SURGEON STRL SZ7 (GLOVE) ×2 IMPLANT
GLOVE BIOGEL PI IND STRL 7.5 (GLOVE) ×1 IMPLANT
GLOVE BIOGEL PI INDICATOR 7.5 (GLOVE) ×1
GOWN STRL REUS W/ TWL LRG LVL3 (GOWN DISPOSABLE) ×3 IMPLANT
GOWN STRL REUS W/TWL LRG LVL3 (GOWN DISPOSABLE) ×3
KIT BASIN OR (CUSTOM PROCEDURE TRAY) ×2 IMPLANT
KIT ROOM TURNOVER OR (KITS) ×2 IMPLANT
NS IRRIG 1000ML POUR BTL (IV SOLUTION) ×2 IMPLANT
PAD ARMBOARD 7.5X6 YLW CONV (MISCELLANEOUS) ×2 IMPLANT
POUCH SPECIMEN RETRIEVAL 10MM (ENDOMECHANICALS) ×2 IMPLANT
SCISSORS LAP 5X35 DISP (ENDOMECHANICALS) ×2 IMPLANT
SET CHOLANGIOGRAPH 5 50 .035 (SET/KITS/TRAYS/PACK) ×2 IMPLANT
SET IRRIG TUBING LAPAROSCOPIC (IRRIGATION / IRRIGATOR) ×2 IMPLANT
SLEEVE ENDOPATH XCEL 5M (ENDOMECHANICALS) ×2 IMPLANT
SPECIMEN JAR SMALL (MISCELLANEOUS) ×2 IMPLANT
SPONGE GAUZE 2X2 STER 10/PKG (GAUZE/BANDAGES/DRESSINGS) ×1
STRIP CLOSURE SKIN 1/2X4 (GAUZE/BANDAGES/DRESSINGS) ×2 IMPLANT
SUT MNCRL AB 4-0 PS2 18 (SUTURE) ×2 IMPLANT
TOWEL OR 17X24 6PK STRL BLUE (TOWEL DISPOSABLE) ×2 IMPLANT
TOWEL OR 17X26 10 PK STRL BLUE (TOWEL DISPOSABLE) ×2 IMPLANT
TRAY LAPAROSCOPIC MC (CUSTOM PROCEDURE TRAY) ×2 IMPLANT
TROCAR XCEL BLUNT TIP 100MML (ENDOMECHANICALS) ×2 IMPLANT
TROCAR XCEL NON-BLD 11X100MML (ENDOMECHANICALS) ×2 IMPLANT
TROCAR XCEL NON-BLD 5MMX100MML (ENDOMECHANICALS) ×2 IMPLANT
TUBING INSUFFLATION (TUBING) ×2 IMPLANT

## 2015-12-19 NOTE — Progress Notes (Signed)
Subjective: Patient still with some RUQ discomfort Pumping breast milk  Objective: Vital signs in last 24 hours: Temp:  [97.6 F (36.4 C)-98.6 F (37 C)] 98.6 F (37 C) (12/21 0512) Pulse Rate:  [61-80] 67 (12/21 0512) Resp:  [13-21] 17 (12/21 0512) BP: (106-136)/(65-99) 106/69 mmHg (12/21 0512) SpO2:  [95 %-100 %] 97 % (12/21 0512) Weight:  [57.924 kg (127 lb 11.2 oz)] 57.924 kg (127 lb 11.2 oz) (12/20 1801) Last BM Date: 12/17/15  Intake/Output from previous day: 12/20 0701 - 12/21 0700 In: 1752.5 [P.O.:440; I.V.:1262.5; IV Piggyback:50] Out: 400 [Urine:400] Intake/Output this shift: Total I/O In: 300 [P.O.:300] Out: -   General appearance: alert, cooperative and no distress GI: mild RUQ tenderness  Lab Results:   Recent Labs  12/18/15 1830 12/19/15 0547  WBC 9.6 5.5  HGB 14.5 12.4  HCT 42.3 37.1  PLT 249 206   BMET  Recent Labs  12/18/15 1140 12/18/15 1830 12/19/15 0547  NA 141  --  140  K 5.0  --  3.4*  CL 108  --  106  CO2 24  --  27  GLUCOSE 120*  --  112*  BUN 9  --  8  CREATININE 0.92 0.80 0.82  CALCIUM 9.3  --  8.4*   Hepatic Function Latest Ref Rng 12/19/2015 12/18/2015 10/20/2015  Total Protein 6.5 - 8.1 g/dL 4.6(L) 5.9(L) 4.8(L)  Albumin 3.5 - 5.0 g/dL 2.9(L) 4.0 2.3(L)  AST 15 - 41 U/L 32 43(H) 26  ALT 14 - 54 U/L 38 24 17  Alk Phosphatase 38 - 126 U/L 60 64 160(H)  Total Bilirubin 0.3 - 1.2 mg/dL 0.4 1.2 0.3     PT/INR No results for input(s): LABPROT, INR in the last 72 hours. ABG No results for input(s): PHART, HCO3 in the last 72 hours.  Invalid input(s): PCO2, PO2  Studies/Results: Ct Abdomen Pelvis Wo Contrast  12/18/2015  CLINICAL DATA:  Right upper quadrant pain. EXAM: CT ABDOMEN AND PELVIS WITHOUT CONTRAST TECHNIQUE: Multidetector CT imaging of the abdomen and pelvis was performed following the standard protocol without IV contrast. COMPARISON:  02/26/2011 FINDINGS: Lower chest:  Clear lung bases.  Normal heart  size. Hepatobiliary: Normal liver. Distended gallbladder with tiny gallstone. Pancreas: Normal. Spleen: Normal. Adrenals/Urinary Tract: Normal adrenal glands. Bilateral punctate nonobstructing renal calculi, right greater than left. No ureterolithiasis. Normal bladder. Stomach/Bowel: No bowel wall thickening or dilatation. No pneumatosis, pneumoperitoneum or portal venous gas. No abdominal or pelvic free fluid. Normal appendix. Vascular/Lymphatic: Normal caliber abdominal aorta. No lymphadenopathy. Reproductive: No pelvic mass.  Normal uterus. Other: No fluid collection or hematoma. Musculoskeletal: No lytic or sclerotic osseous lesion. Mild dextrocurvature of the lumbar spine. No acute osseous abnormality. IMPRESSION: 1. Tiny cholelithiasis with a distended gallbladder. If there is further clinical concern regarding gallbladder pathology, recommend a dedicated right upper quadrant ultrasound. Electronically Signed   By: Kathreen Devoid   On: 12/18/2015 13:46   Dg Chest 2 View  12/18/2015  CLINICAL DATA:  Shortness of breath, right lower quadrant pain and vomiting beginning this morning. Initial encounter. EXAM: CHEST  2 VIEW COMPARISON:  CT chest and single view of the chest 10/12/2015. FINDINGS: Suture is seen in the right upper lobe. Emphysematous change is seen the upper lobes bilaterally. The lungs are clear. Heart size is normal. No pneumothorax or pleural effusion. No focal bony abnormality. IMPRESSION: No acute disease. Electronically Signed   By: Inge Rise M.D.   On: 12/18/2015 12:17   US Abdomen Limited  12/18/2015  CLINICAL DATA:  Gallstones demonstrated on CT scan of today's date EXAM: US ABDOMEN LIMITED - RIGHT UPPER QUADRANT COMPARISON:  A noncontrast abdominal and pelvic CT scan of today's date FINDINGS: Gallbladder: The gallbladder is mildly distended. There are multiple tiny mobile gallstones. There is no gallbladder wall thickening. There trace of pericholecystic fluid. There is no  positive sonographic Murphy's sign. Common bile duct: Diameter: 6.8 mm. No abnormal intraluminal filling defects are observed in the common bile duct. Liver: The hepatic echotexture is normal. There is no focal mass nor ductal dilation. IMPRESSION: Multiple tiny gallstones associated with gallbladder wall thickening, pericholecystic fluid, and gallbladder distention. This may reflect acute or subacute cholecystitis. No positive sonographic Murphy's sign was observed however. No common bile duct stones are demonstrated. Electronically Signed   By: David  Martinique M.D.   On: 12/18/2015 15:43    Anti-infectives: Anti-infectives    Start     Dose/Rate Route Frequency Ordered Stop   12/18/15 1715  cefTRIAXone (ROCEPHIN) 2 g in dextrose 5 % 50 mL IVPB     2 g 100 mL/hr over 30 Minutes Intravenous Every 24 hours 12/18/15 1713        Assessment/Plan: s/p Procedure(s): LAPAROSCOPIC CHOLECYSTECTOMY WITH INTRAOPERATIVE CHOLANGIOGRAM (N/A) Plan procedure today.  Possible discharge later today or tomorrow morning.  The surgical procedure has been discussed with the patient.  Potential risks, benefits, alternative treatments, and expected outcomes have been explained.  All of the patient's questions at this time have been answered.  The likelihood of reaching the patient's treatment goal is good.  The patient understand the proposed surgical procedure and wishes to proceed.      Jaeceon Michelin K. 12/19/2015

## 2015-12-19 NOTE — Progress Notes (Signed)
Pt discharge to home. Discharge instructions has been given.

## 2015-12-19 NOTE — Op Note (Signed)
Laparoscopic Cholecystectomy with IOC Procedure Note  Indications: This patient presents with symptomatic gallbladder disease and will undergo laparoscopic cholecystectomy.  Pre-operative Diagnosis: Calculus of gallbladder with acute cholecystitis, without mention of obstruction  Post-operative Diagnosis: Same  Surgeon: Kathalene Sporer K.   Assistants: none  Anesthesia: General endotracheal anesthesia  ASA Class: 1  Procedure Details  The patient was seen again in the Holding Room. The risks, benefits, complications, treatment options, and expected outcomes were discussed with the patient. The possibilities of reaction to medication, pulmonary aspiration, perforation of viscus, bleeding, recurrent infection, finding a normal gallbladder, the need for additional procedures, failure to diagnose a condition, the possible need to convert to an open procedure, and creating a complication requiring transfusion or operation were discussed with the patient. The likelihood of improving the patient's symptoms with return to their baseline status is good.  The patient and/or family concurred with the proposed plan, giving informed consent. The site of surgery properly noted. The patient was taken to Operating Room, identified as [name] [name] and the procedure verified as Laparoscopic Cholecystectomy with Intraoperative Cholangiogram. A Time Out was held and the above information confirmed.  Prior to the induction of general anesthesia, antibiotic prophylaxis was administered. General endotracheal anesthesia was then administered and tolerated well. After the induction, the abdomen was prepped with Chloraprep and draped in the sterile fashion. The patient was positioned in the supine position.  Local anesthetic agent was injected into the skin below the umbilicus and an incision made. We dissected down to the abdominal fascia with blunt dissection.  The fascia was incised vertically and we entered the  peritoneal cavity bluntly.  A pursestring suture of 0-Vicryl was placed around the fascial opening.  The Hasson cannula was inserted and secured with the stay suture.  Pneumoperitoneum was then created with CO2 and tolerated well without any adverse changes in the patient's vital signs. An 11-mm port was placed in the subxiphoid position.  Two 5-mm ports were placed in the right upper quadrant. All skin incisions were infiltrated with a local anesthetic agent before making the incision and placing the trocars.   We positioned the patient in reverse Trendelenburg, tilted slightly to the patient's left.  The gallbladder was identified, the fundus grasped and retracted cephalad. Adhesions were lysed bluntly and with the electrocautery where indicated, taking care not to injure any adjacent organs or viscus. The infundibulum was grasped and retracted laterally, exposing the peritoneum overlying the triangle of Calot. This was then divided and exposed in a blunt fashion. A critical view of the cystic duct and cystic artery was obtained.  The cystic duct was clearly identified and bluntly dissected circumferentially. The cystic duct was ligated with a clip distally.   An incision was made in the cystic duct and the [facility] cholangiogram catheter introduced. The catheter was secured using a clip. A cholangiogram was then obtained which showed good visualization of the distal and proximal biliary tree with no sign of filling defects or obstruction.  Contrast flowed easily into the duodenum. The catheter was then removed.   The cystic duct was then ligated with clips and divided. The cystic artery was identified, dissected free, ligated with clips and divided as well.   The gallbladder was dissected from the liver bed in retrograde fashion with the electrocautery. The gallbladder was removed and placed in an Endocatch sac. The liver bed was irrigated and inspected. Hemostasis was achieved with the electrocautery. Copious  irrigation was utilized and was repeatedly aspirated until clear.  The gallbladder and Endocatch sac were then removed through the umbilical port site.  The pursestring suture was used to close the umbilical fascia.    We again inspected the right upper quadrant for hemostasis.  Pneumoperitoneum was released as we removed the trocars.  4-0 Monocryl was used to close the skin.   Benzoin, steri-strips, and clean dressings were applied. The patient was then extubated and brought to the recovery room in stable condition. Instrument, sponge, and needle counts were correct at closure and at the conclusion of the case.   Findings: Cholecystitis with Cholelithiasis  Estimated Blood Loss: Minimal         Drains: none         Specimens: Gallbladder           Complications: None; patient tolerated the procedure well.         Disposition: PACU - hemodynamically stable.         Condition: stable

## 2015-12-19 NOTE — Progress Notes (Signed)
2 IV attempts: right lateral forearm and right hand sites unremarkable and unsuccessful.

## 2015-12-19 NOTE — Progress Notes (Signed)
Report called to Marcelino DusterMichelle, RN in short stay. Patient going to surgery.

## 2015-12-19 NOTE — Anesthesia Preprocedure Evaluation (Addendum)
Anesthesia Evaluation  Patient identified by MRN, date of birth, ID band Patient awake    Reviewed: Allergy & Precautions, NPO status , Patient's Chart, lab work & pertinent test results  Airway Mallampati: II  TM Distance: >3 FB Neck ROM: Full    Dental no notable dental hx. (+) Partial Lower, Partial Upper, Poor Dentition, Chipped, Missing, Dental Advisory Given,    Pulmonary former smoker,    Pulmonary exam normal breath sounds clear to auscultation       Cardiovascular negative cardio ROS Normal cardiovascular exam Rhythm:Regular Rate:Normal     Neuro/Psych  Headaches, PSYCHIATRIC DISORDERS Anxiety Depression PTSD   GI/Hepatic negative GI ROS, Neg liver ROS,   Endo/Other  negative endocrine ROS  Renal/GU negative Renal ROS  negative genitourinary   Musculoskeletal negative musculoskeletal ROS (+)   Abdominal   Peds  Hematology  (+) anemia ,   Anesthesia Other Findings   Reproductive/Obstetrics (+) Pregnancy                            Anesthesia Physical  Anesthesia Plan  ASA: II  Anesthesia Plan: General   Post-op Pain Management:    Induction: Intravenous  Airway Management Planned: Oral ETT  Additional Equipment:   Intra-op Plan:   Post-operative Plan: Extubation in OR  Informed Consent: I have reviewed the patients History and Physical, chart, labs and discussed the procedure including the risks, benefits and alternatives for the proposed anesthesia with the patient or authorized representative who has indicated his/her understanding and acceptance.   Dental advisory given  Plan Discussed with: CRNA  Anesthesia Plan Comments:         Anesthesia Quick Evaluation

## 2015-12-19 NOTE — Anesthesia Postprocedure Evaluation (Signed)
Anesthesia Post Note  Patient: [name] [name]  Procedure(s) Performed: Procedure(s) (LRB): LAPAROSCOPIC CHOLECYSTECTOMY WITH INTRAOPERATIVE CHOLANGIOGRAM (N/A)  Patient location during evaluation: PACU Anesthesia Type: General Level of consciousness: awake and alert, oriented and patient cooperative Pain management: pain level controlled Vital Signs Assessment: post-procedure vital signs reviewed and stable Respiratory status: spontaneous breathing, nonlabored ventilation and respiratory function stable Cardiovascular status: blood pressure returned to baseline and stable Postop Assessment: no signs of nausea or vomiting Anesthetic complications: no Comments: Pt received Sugammadex: contraception education completed    Last Vitals:  Filed Vitals:   12/19/15 1542 12/19/15 1545  BP: 135/73   Pulse: 61 63  Temp:    Resp: 11 13    Last Pain:  Filed Vitals:   12/19/15 1549  PainSc: 5                  [name],E. Anne Sebring

## 2015-12-19 NOTE — Discharge Instructions (Signed)
Laparoscopic Cholecystectomy, Care After °Refer to this sheet in the next few weeks. These instructions provide you with information about caring for yourself after your procedure. Your health care provider may also give you more specific instructions. Your treatment has been planned according to current medical practices, but problems sometimes occur. Call your health care provider if you have any problems or questions after your procedure. °WHAT TO EXPECT AFTER THE PROCEDURE °After your procedure, it is common to have: °· Pain at your incision sites. You will be given pain medicines to control your pain. °· Mild nausea or vomiting. This should improve after the first 24 hours. °· Bloating and possible shoulder pain from the gas that was used during the procedure. This will improve after the first 24 hours. °HOME CARE INSTRUCTIONS °Incision Care °· Follow instructions from your health care provider about how to take care of your incisions. Make sure you: °¨ Wash your hands with soap and water before you change your bandage (dressing). If soap and water are not available, use hand sanitizer. °¨ Change your dressing as told by your health care provider. °¨ Leave stitches (sutures), skin glue, or adhesive strips in place. These skin closures may need to be in place for 2 weeks or longer. If adhesive strip edges start to loosen and curl up, you may trim the loose edges. Do not remove adhesive strips completely unless your health care provider tells you to do that. °· Do not take baths, swim, or use a hot tub until your health care provider approves. Ask your health care provider if you can take showers. You may only be allowed to take sponge baths for bathing. °General Instructions °· Take over-the-counter and prescription medicines only as told by your health care provider. °· Do not drive or operate heavy machinery while taking prescription pain medicine. °· Return to your normal diet as told by your health care  provider. °· Do not lift anything that is heavier than 10 lb (4.5 kg). °· Do not play contact sports for one week or until your health care provider approves. °SEEK MEDICAL CARE IF:  °· You have redness, swelling, or pain at the site of your incision. °· You have fluid, blood, or pus coming from your incision. °· You notice a bad smell coming from your incision area. °· Your surgical incisions break open. °· You have a fever. °SEEK IMMEDIATE MEDICAL CARE IF: °· You develop a rash. °· You have difficulty breathing. °· You have chest pain. °· You have increasing pain in your shoulders (shoulder strap areas). °· You faint or have dizzy episodes while you are standing. °· You have severe pain in your abdomen. °· You have nausea or vomiting that lasts for more than one day. °  °This information is not intended to replace advice given to you by your health care provider. Make sure you discuss any questions you have with your health care provider. °  °Document Released: 12/15/2005 Document Revised: 09/05/2015 Document Reviewed: 07/27/2013 °Elsevier Interactive Patient Education ©2016 [employer] °CCS ______[facility] °LAPAROSCOPIC SURGERY: POST OP INSTRUCTIONS °Always review your discharge instruction sheet given to you by the facility where your surgery was performed. °IF YOU HAVE DISABILITY OR FAMILY LEAVE FORMS, YOU MUST BRING THEM TO THE OFFICE FOR PROCESSING.   °DO NOT GIVE THEM TO YOUR DOCTOR. ° °1. A prescription for pain medication may be given to you upon discharge.  Take your pain medication as prescribed, if needed.  If narcotic   pain medicine is not needed, then you may take acetaminophen (Tylenol) or ibuprofen (Advil) as needed. °2. Take your usually prescribed medications unless otherwise directed. °3. If you need a refill on your pain medication, please contact your pharmacy.  They will contact our office to request authorization. Prescriptions will not be filled after 5pm or on  week-ends. °4. You should follow a light diet the first few days after arrival home, such as soup and crackers, etc.  Be sure to include lots of fluids daily. °5. Most patients will experience some swelling and bruising in the area of the incisions.  Ice packs will help.  Swelling and bruising can take several days to resolve.  °6. It is common to experience some constipation if taking pain medication after surgery.  Increasing fluid intake and taking a stool softener (such as Colace) will usually help or prevent this problem from occurring.  A mild laxative (Milk of Magnesia or Miralax) should be taken according to package instructions if there are no bowel movements after 48 hours. °7. Unless discharge instructions indicate otherwise, you may remove your bandages 24-48 hours after surgery, and you may shower at that time.  You may have steri-strips (small skin tapes) in place directly over the incision.  These strips should be left on the skin for 7-10 days.  If your surgeon used skin glue on the incision, you may shower in 24 hours.  The glue will flake off over the next 2-3 weeks.  Any sutures or staples will be removed at the office during your follow-up visit. °8. ACTIVITIES:  You may resume regular (light) daily activities beginning the next day--such as daily self-care, walking, climbing stairs--gradually increasing activities as tolerated.  You may have sexual intercourse when it is comfortable.  Refrain from any heavy lifting or straining until approved by your doctor. °a. You may drive when you are no longer taking prescription pain medication, you can comfortably wear a seatbelt, and you can safely maneuver your car and apply brakes. °b. RETURN TO WORK:  __________________________________________________________ °9. You should see your doctor in the office for a follow-up appointment approximately 2-3 weeks after your surgery.  Make sure that you call for this appointment within a day or two after you  arrive home to insure a convenient appointment time. °10. OTHER INSTRUCTIONS: __________________________________________________________________________________________________________________________ __________________________________________________________________________________________________________________________ °WHEN TO CALL YOUR DOCTOR: °1. Fever over 101.0 °2. Inability to urinate °3. Continued bleeding from incision. °4. Increased pain, redness, or drainage from the incision. °5. Increasing abdominal pain ° °The clinic staff is available to answer your questions during regular business hours.  Please don’t hesitate to call and ask to speak to one of the nurses for clinical concerns.  If you have a medical emergency, go to the nearest emergency room or call 911.  A surgeon from [facility] is always on call at the hospital. °[address], [address], [place], [place]  [address] ? [address] [address], [place], [place]   [address] °[phone] ? [phone] ? FAX [phone] °Web site: [email or web address] ° °

## 2015-12-19 NOTE — Transfer of Care (Signed)
Immediate Anesthesia Transfer of Care Note  Patient: [name] DeterSherrie M Whedbee  Procedure(s) Performed: Procedure(s): LAPAROSCOPIC CHOLECYSTECTOMY WITH INTRAOPERATIVE CHOLANGIOGRAM (N/A)  Patient Location: PACU  Anesthesia Type:General  Level of Consciousness: lethargic and responds to stimulation  Airway & Oxygen Therapy: Patient Spontanous Breathing and Patient connected to nasal cannula oxygen  Post-op Assessment: Report given to RN  Post vital signs: Reviewed and stable  Last Vitals:  Filed Vitals:   12/19/15 0512 12/19/15 1134  BP: 106/69 137/77  Pulse: 67 83  Temp: 37 C 36.8 C  Resp: 17 18    Complications: No apparent anesthesia complications

## 2015-12-19 NOTE — Anesthesia Procedure Notes (Signed)
Procedure Name: Intubation Date/Time: 12/19/2015 1:54 PM Performed by: Jefm MilesENNIE, Letrice Pollok E Pre-anesthesia Checklist: Patient identified, Emergency Drugs available, Suction available, Patient being monitored and Timeout performed Patient Re-evaluated:Patient Re-evaluated prior to inductionOxygen Delivery Method: Circle system utilized Preoxygenation: Pre-oxygenation with 100% oxygen Intubation Type: IV induction, Rapid sequence and Cricoid Pressure applied Laryngoscope Size: Mac and 3 Grade View: Grade I Tube type: Oral Tube size: 7.0 mm Number of attempts: 1 Airway Equipment and Method: Stylet Placement Confirmation: ETT inserted through vocal cords under direct vision,  positive ETCO2 and breath sounds checked- equal and bilateral Secured at: 21 cm Tube secured with: Tape Dental Injury: Teeth and Oropharynx as per pre-operative assessment

## 2015-12-20 ENCOUNTER — Encounter (HOSPITAL_COMMUNITY): Payer: Self-pay | Admitting: Surgery

## 2015-12-21 ENCOUNTER — Encounter (HOSPITAL_COMMUNITY): Payer: Self-pay

## 2015-12-21 ENCOUNTER — Emergency Department (HOSPITAL_COMMUNITY): Payer: 59

## 2015-12-21 ENCOUNTER — Emergency Department (HOSPITAL_COMMUNITY)
Admission: EM | Admit: 2015-12-21 | Discharge: 2015-12-21 | Disposition: A | Discharge status: Home / Self Care | Payer: 59 | Attending: Emergency Medicine | Admitting: Emergency Medicine

## 2015-12-21 DIAGNOSIS — Z87440 Personal history of urinary (tract) infections: Secondary | ICD-10-CM | POA: Insufficient documentation

## 2015-12-21 DIAGNOSIS — Z9049 Acquired absence of other specified parts of digestive tract: Secondary | ICD-10-CM | POA: Diagnosis not present

## 2015-12-21 DIAGNOSIS — Z8659 Personal history of other mental and behavioral disorders: Secondary | ICD-10-CM | POA: Diagnosis not present

## 2015-12-21 DIAGNOSIS — Z8781 Personal history of (healed) traumatic fracture: Secondary | ICD-10-CM | POA: Insufficient documentation

## 2015-12-21 DIAGNOSIS — R079 Chest pain, unspecified: Secondary | ICD-10-CM

## 2015-12-21 DIAGNOSIS — Z87891 Personal history of nicotine dependence: Secondary | ICD-10-CM | POA: Insufficient documentation

## 2015-12-21 DIAGNOSIS — R1084 Generalized abdominal pain: Secondary | ICD-10-CM | POA: Diagnosis not present

## 2015-12-21 DIAGNOSIS — R0602 Shortness of breath: Secondary | ICD-10-CM | POA: Diagnosis not present

## 2015-12-21 DIAGNOSIS — R110 Nausea: Secondary | ICD-10-CM | POA: Diagnosis not present

## 2015-12-21 DIAGNOSIS — G8918 Other acute postprocedural pain: Secondary | ICD-10-CM | POA: Diagnosis not present

## 2015-12-21 LAB — CBC
HEMATOCRIT: 40.4 % (ref 36.0–46.0)
Hemoglobin: 13.6 g/dL (ref 12.0–15.0)
MCH: 30.5 pg (ref 26.0–34.0)
MCHC: 33.7 g/dL (ref 30.0–36.0)
MCV: 90.6 fL (ref 78.0–100.0)
PLATELETS: 257 10*3/uL (ref 150–400)
RBC: 4.46 MIL/uL (ref 3.87–5.11)
RDW: 12.5 % (ref 11.5–15.5)
WBC: 7.2 10*3/uL (ref 4.0–10.5)

## 2015-12-21 LAB — HEPATIC FUNCTION PANEL
ALBUMIN: 3.6 g/dL (ref 3.5–5.0)
ALT: 32 U/L (ref 14–54)
AST: 23 U/L (ref 15–41)
Alkaline Phosphatase: 57 U/L (ref 38–126)
Bilirubin, Direct: 0.1 mg/dL (ref 0.1–0.5)
Indirect Bilirubin: 0.4 mg/dL (ref 0.3–0.9)
TOTAL PROTEIN: 5.8 g/dL — AB (ref 6.5–8.1)
Total Bilirubin: 0.5 mg/dL (ref 0.3–1.2)

## 2015-12-21 LAB — LIPASE, BLOOD: LIPASE: 18 U/L (ref 11–51)

## 2015-12-21 LAB — BASIC METABOLIC PANEL
ANION GAP: 11 (ref 5–15)
BUN: 8 mg/dL (ref 6–20)
CALCIUM: 9.4 mg/dL (ref 8.9–10.3)
CO2: 25 mmol/L (ref 22–32)
CREATININE: 0.83 mg/dL (ref 0.44–1.00)
Chloride: 104 mmol/L (ref 101–111)
GFR calc non Af Amer: >60 mL/min (ref >60)
Glucose, Bld: 113 mg/dL — ABNORMAL HIGH (ref 65–99)
Potassium: 3.9 mmol/L (ref 3.5–5.1)
SODIUM: 140 mmol/L (ref 135–145)

## 2015-12-21 LAB — I-STAT TROPONIN, ED: TROPONIN I, POC: 0 ng/mL (ref 0.00–0.08)

## 2015-12-21 MED ORDER — HYDROMORPHONE HCL 1 MG/ML IJ SOLN
1.0000 mg | Freq: Once | INTRAMUSCULAR | Status: AC
  Administered 2015-12-21: 1 mg via INTRAVENOUS
  Filled 2015-12-21: qty 1

## 2015-12-21 MED ORDER — GI COCKTAIL ~~LOC~~
30.0000 mL | Freq: Once | ORAL | Status: AC
  Administered 2015-12-21: 30 mL via ORAL
  Filled 2015-12-21: qty 30

## 2015-12-21 MED ORDER — ONDANSETRON HCL 4 MG/2ML IJ SOLN
4.0000 mg | Freq: Once | INTRAMUSCULAR | Status: AC
  Administered 2015-12-21: 4 mg via INTRAVENOUS
  Filled 2015-12-21: qty 2

## 2015-12-21 MED ORDER — IOHEXOL 350 MG/ML SOLN
80.0000 mL | Freq: Once | INTRAVENOUS | Status: AC | PRN
  Administered 2015-12-21: 100 mL via INTRAVENOUS

## 2015-12-21 MED ORDER — HYDROMORPHONE HCL 1 MG/ML IJ SOLN
1.0000 mg | Freq: Once | INTRAMUSCULAR | Status: AC
Start: 2015-12-21 — End: 2015-12-21
  Administered 2015-12-21: 1 mg via INTRAVENOUS
  Filled 2015-12-21: qty 1

## 2015-12-21 MED ORDER — PANTOPRAZOLE SODIUM 40 MG PO TBEC: 40.0000 mg | DELAYED_RELEASE_TABLET | Freq: Every day | ORAL | Status: DC

## 2015-12-21 NOTE — ED Notes (Signed)
Pt able to handle PO challenge without difficulty.

## 2015-12-21 NOTE — ED Notes (Signed)
Patient transported to CT 

## 2015-12-21 NOTE — Discharge Instructions (Signed)

## 2015-12-21 NOTE — ED Notes (Signed)
Pt departed in NAD.  
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
## 2015-12-21 NOTE — ED Notes (Signed)
Per EMS - pt c/o sudden onset substernal CP around 0430 this afternoon. Pain worse w/ movement and deep inspiration Pt had gallbladder surgery 2 days ago. Hx spontaneous pneumothorax.   Given 2 nitro and 324mg  aspirin w/ no relief.

## 2015-12-27 NOTE — Discharge Summary (Signed)
Physician Discharge Summary  Patient ID: [name] DeterSherrie M Filley MRN: [id number] DOB/AGE: 29/02/1986 29 y.o.  Admit date: 12/18/2015 Discharge date: 12/19/15  Admission Diagnoses:   Calculus of gallbladder with acute cholecystitis, without mention of obstruction  Discharge Diagnoses:  Same Active Problems:   Cholelithiasis and acute cholecystitis without obstruction   PROCEDURES: Laparoscopic Cholecystectomy with IOC , 12/21/1/6, Dr. Candice CampMatthew Tsuei  Hospital Course:  Pt is 2 months post partum. She was fine this Am and took her husband to work. When she got Home she started having pain RUQ going to her back. It felt like her pneumothorax when it started. Pain at home was 10/10 and she came to the ED. She developed nausea and vomiting with multiple episodes in the ED since she has arrived. She was just given pain med and said it was 8/10. Pain remains in the RUQ. Work up in the ED shows she is afebrile, VSS. Labs are normal, WBC is normal. CXR is OK. CT of the abdomen and pelvis shows: Tiny cholelithiasis with a distended gallbladder. Ultrasound shows tiny gallstone with a distended GB concerning for early acute cholecystitis. We admitted her and started her on IV fluids and antibiotics.  She was taken to the OR the following day, and underwent cholecystectomy.  She was taking a full diet, tolerating ambulation with just PO pain meds and went home that evening.  Condition on d/c:  Improved    Disposition: 01-Home or Self Care  Discharge Instructions    Discharge instructions    Complete by:  As directed   During your recent anesthetic, you were given the medication sugammadex (Bridion). This medication interacts with hormonal forms of birth control (oral contraceptives and injected or implanted birth control) and may make them ineffective. IFYOU USE ANY HORMONAL FORM OF BIRTH CONTROL, YOU MUST USE AN ADDITIONAL BARRIER BIRTH CONTROL FOR METHOD FOR SEVEN DAYS after receiving  sugammadex (Bridion) or there is a chance you could become pregnant.            Medication List    STOP taking these medications        pantoprazole 40 MG tablet  Commonly known as:  PROTONIX     potassium chloride 20 MEQ packet  Commonly known as:  KLOR-CON      TAKE these medications        acetaminophen 325 MG tablet  Commonly known as:  TYLENOL  Take 650 mg by mouth every 6 (six) hours as needed for mild pain.     calcium carbonate 500 MG chewable tablet  Commonly known as:  TUMS - dosed in mg elemental calcium  Chew 1 tablet by mouth daily as needed for indigestion or heartburn.     oxyCODONE-acetaminophen 5-325 MG tablet  Commonly known as:  PERCOCET/ROXICET  Take 1 tablet by mouth every 4 (four) hours as needed for moderate pain.           Follow-up Information    Follow up with [facility] On 01/02/2016.   Specialty:  General Surgery   Why:  Your appointment is at 11:30 AM, call tomorrow and be sure it made the computer.  Be at the office 30 minutes early for check in day of appointment.   Contact information:   [address] [place] [address] [phone]       Signed: Ragen GeorgeJENNINGS,Hurley Sobel 12/27/2015, 9:31 AM

## 2016-03-18 IMAGING — CR DG CHEST 1V PORT
2 series · 2 of 2 positions shown · non-contrast
Comparison: Yesterday at 641 hour

CLINICAL DATA: Follow up pneumothorax, chest tube in place.

EXAM:
PORTABLE CHEST - 1 VIEW

[AP (1 of 2)]
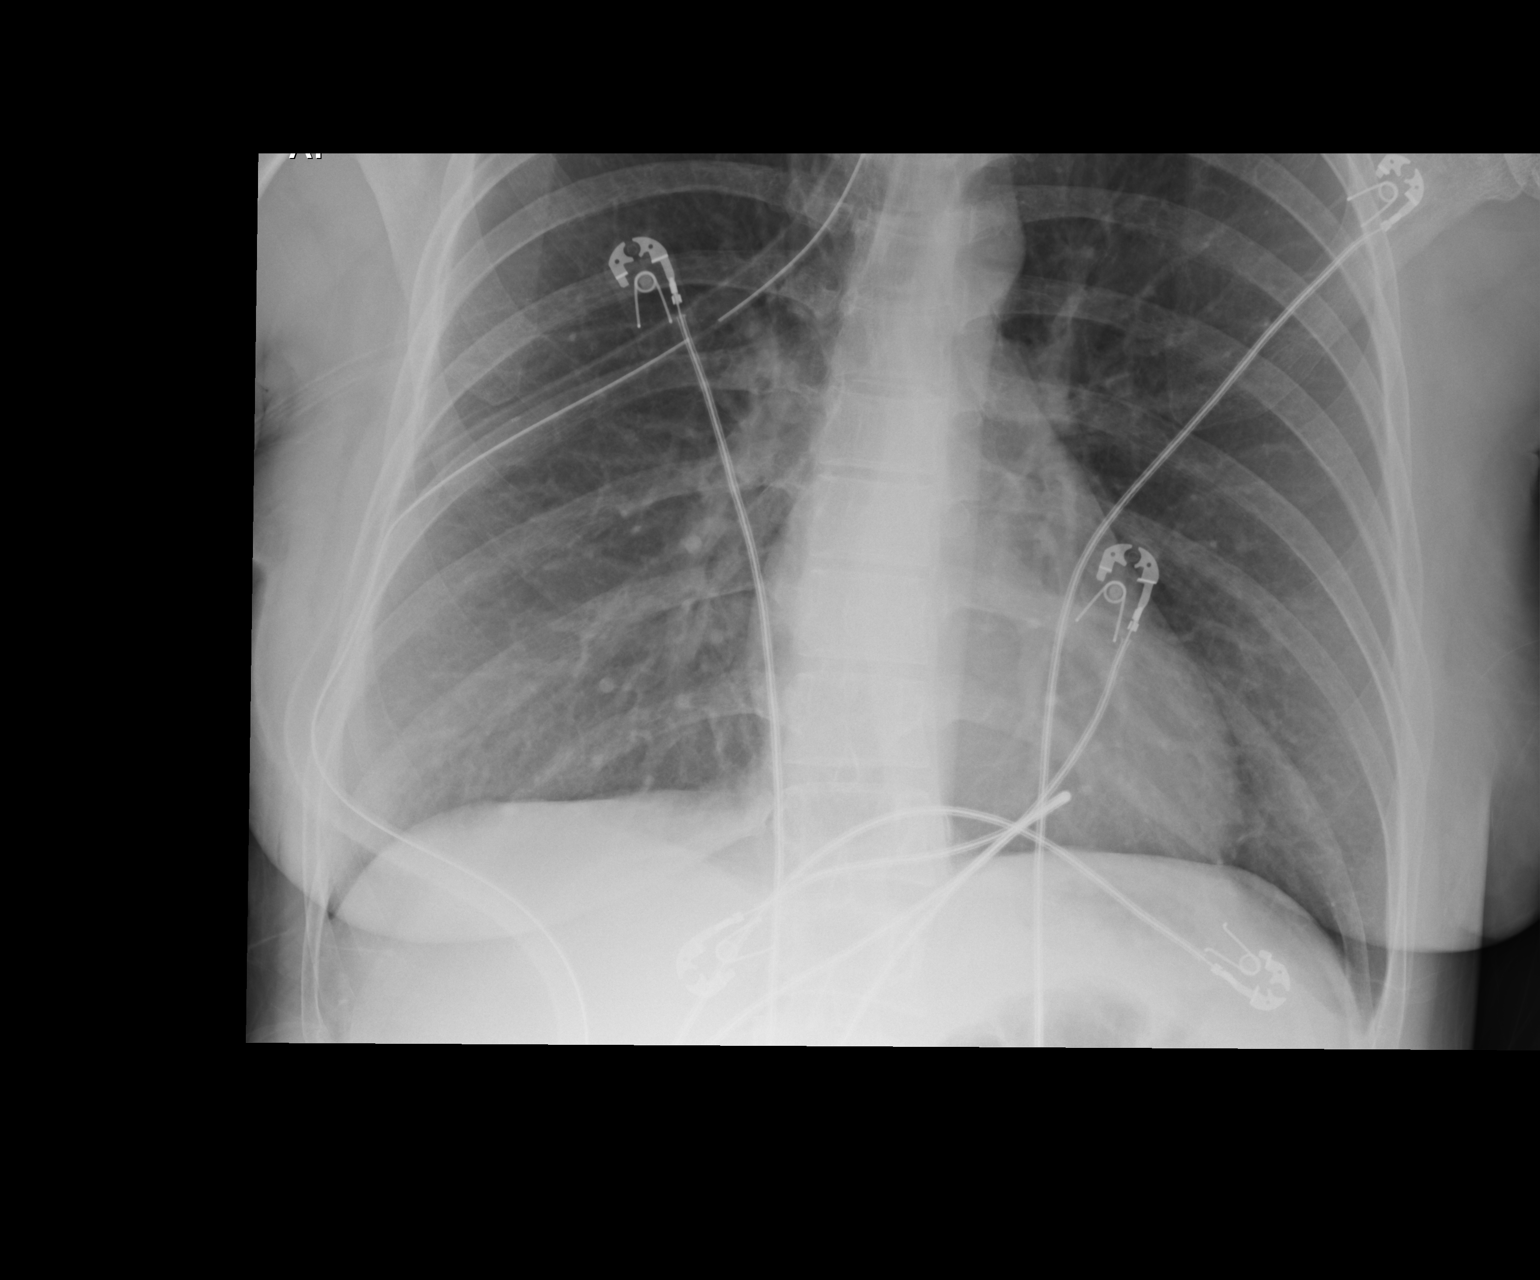

[AP (2 of 2)]
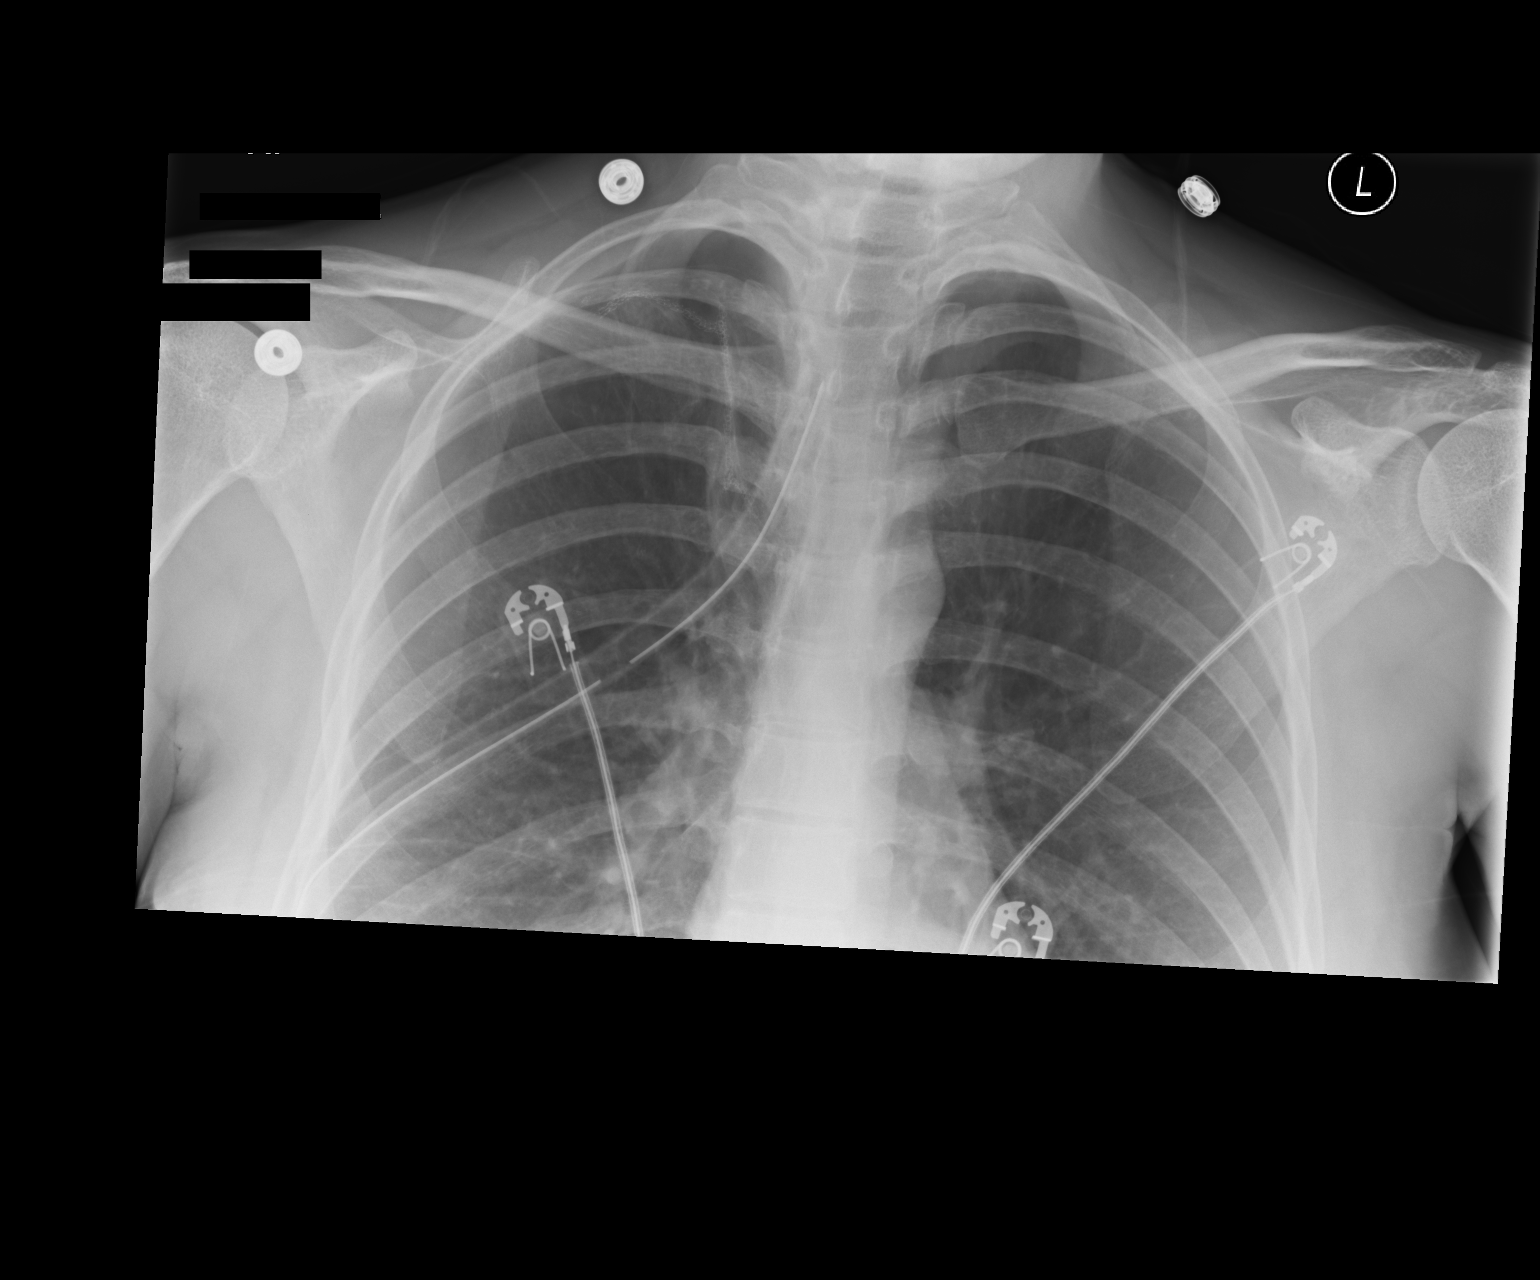

[2 of 2 positions shown; findings below may reference images not displayed]

FINDINGS: Tip of the right chest tube at the lung apex. Unchanged small right
apical pneumothorax. Sutures noted the right lung apex. No
mediastinal shift. The lungs are otherwise clear. Cardiomediastinal
contours are normal. Pulmonary vasculature is normal.
IMPRESSION: Stable small right apical pneumothorax with right chest tube in
place.

## 2016-03-19 IMAGING — DX DG CHEST 2V
2 series · 2 of 2 positions shown · non-contrast
Comparison: Portable chest x-rays yesterday dating back to
05/10/2015.

CLINICAL DATA: Follow-up right apical pneumothorax. Postop day 5
from right VATS for bullectomy.

EXAM:
CHEST  2 VIEW

[chest pa]
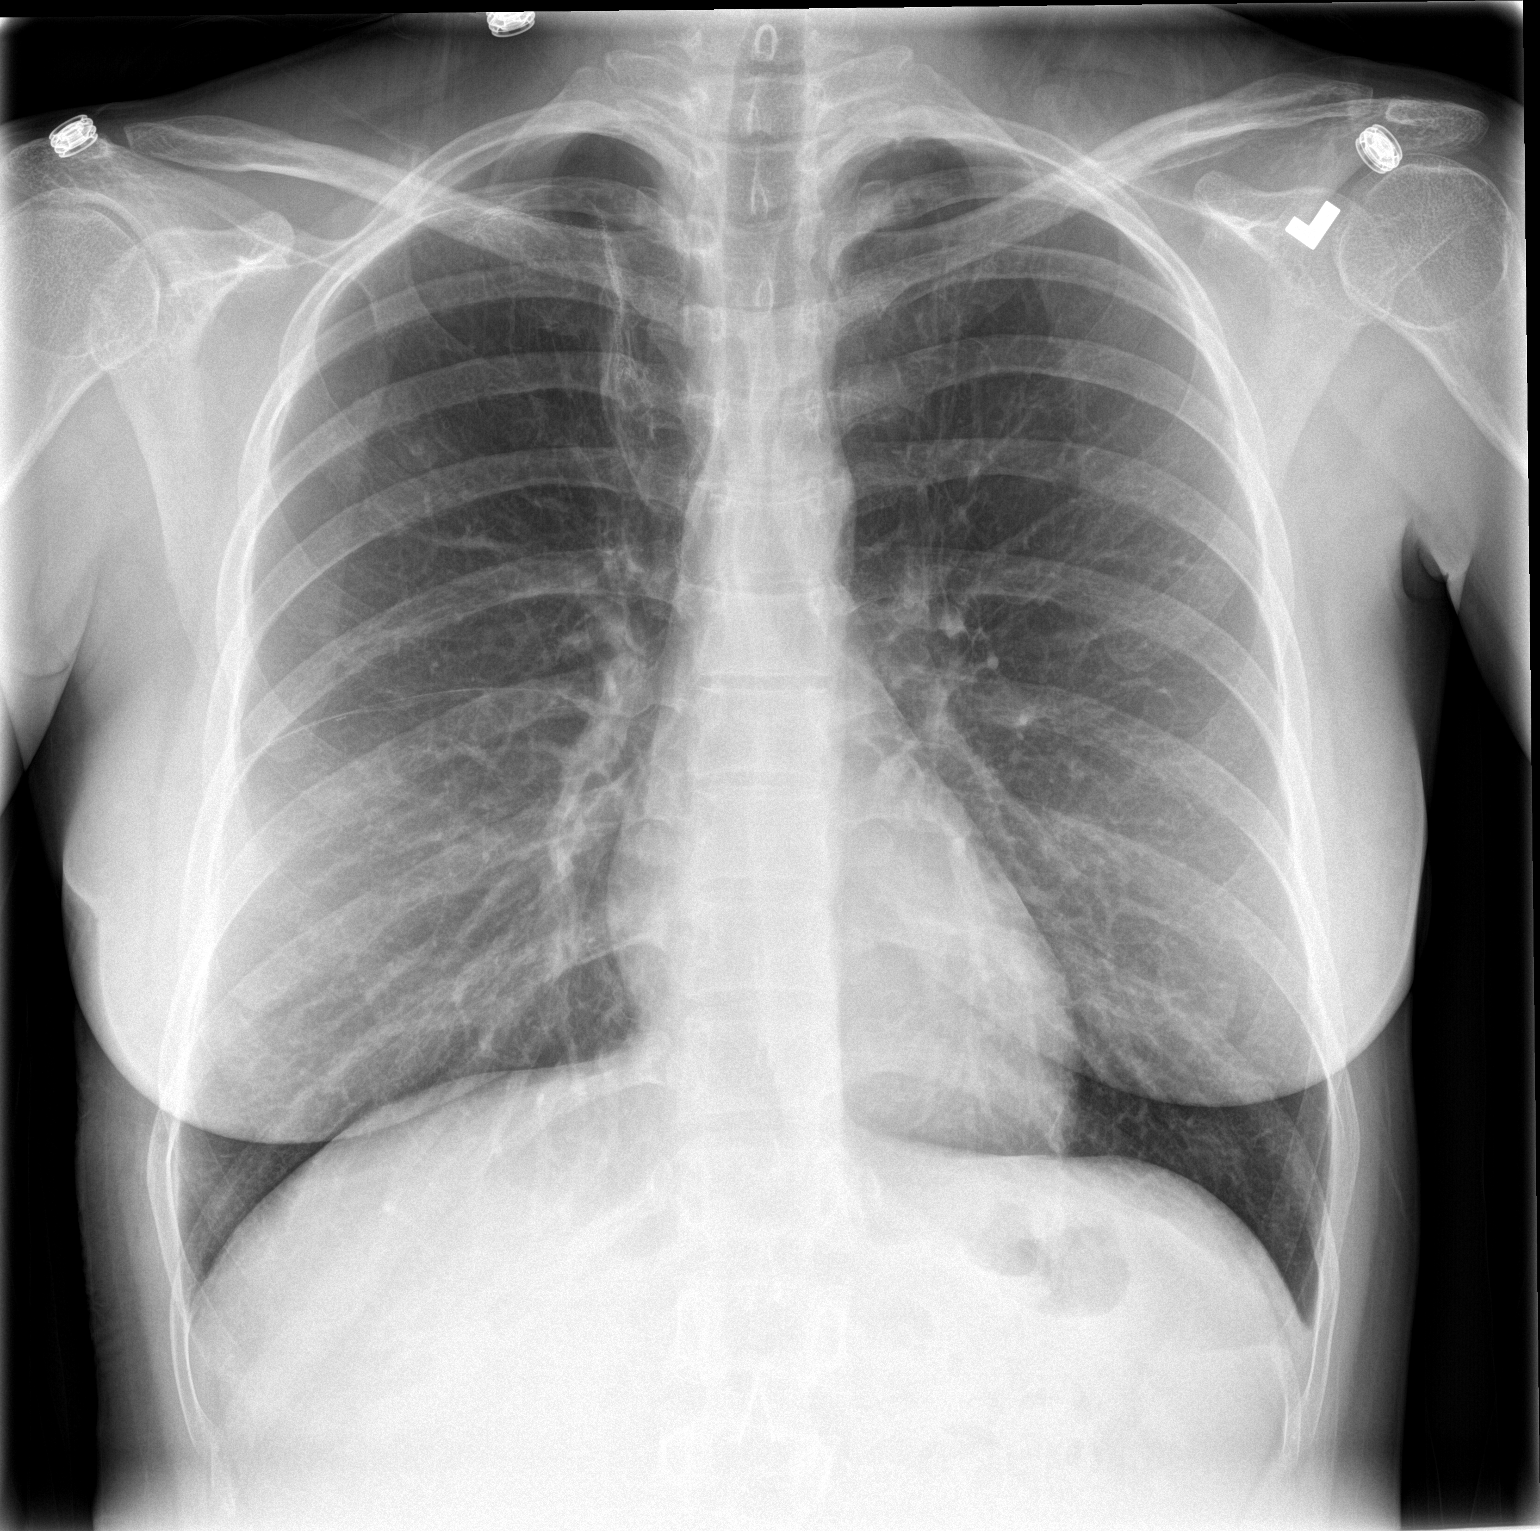

[chest lat]
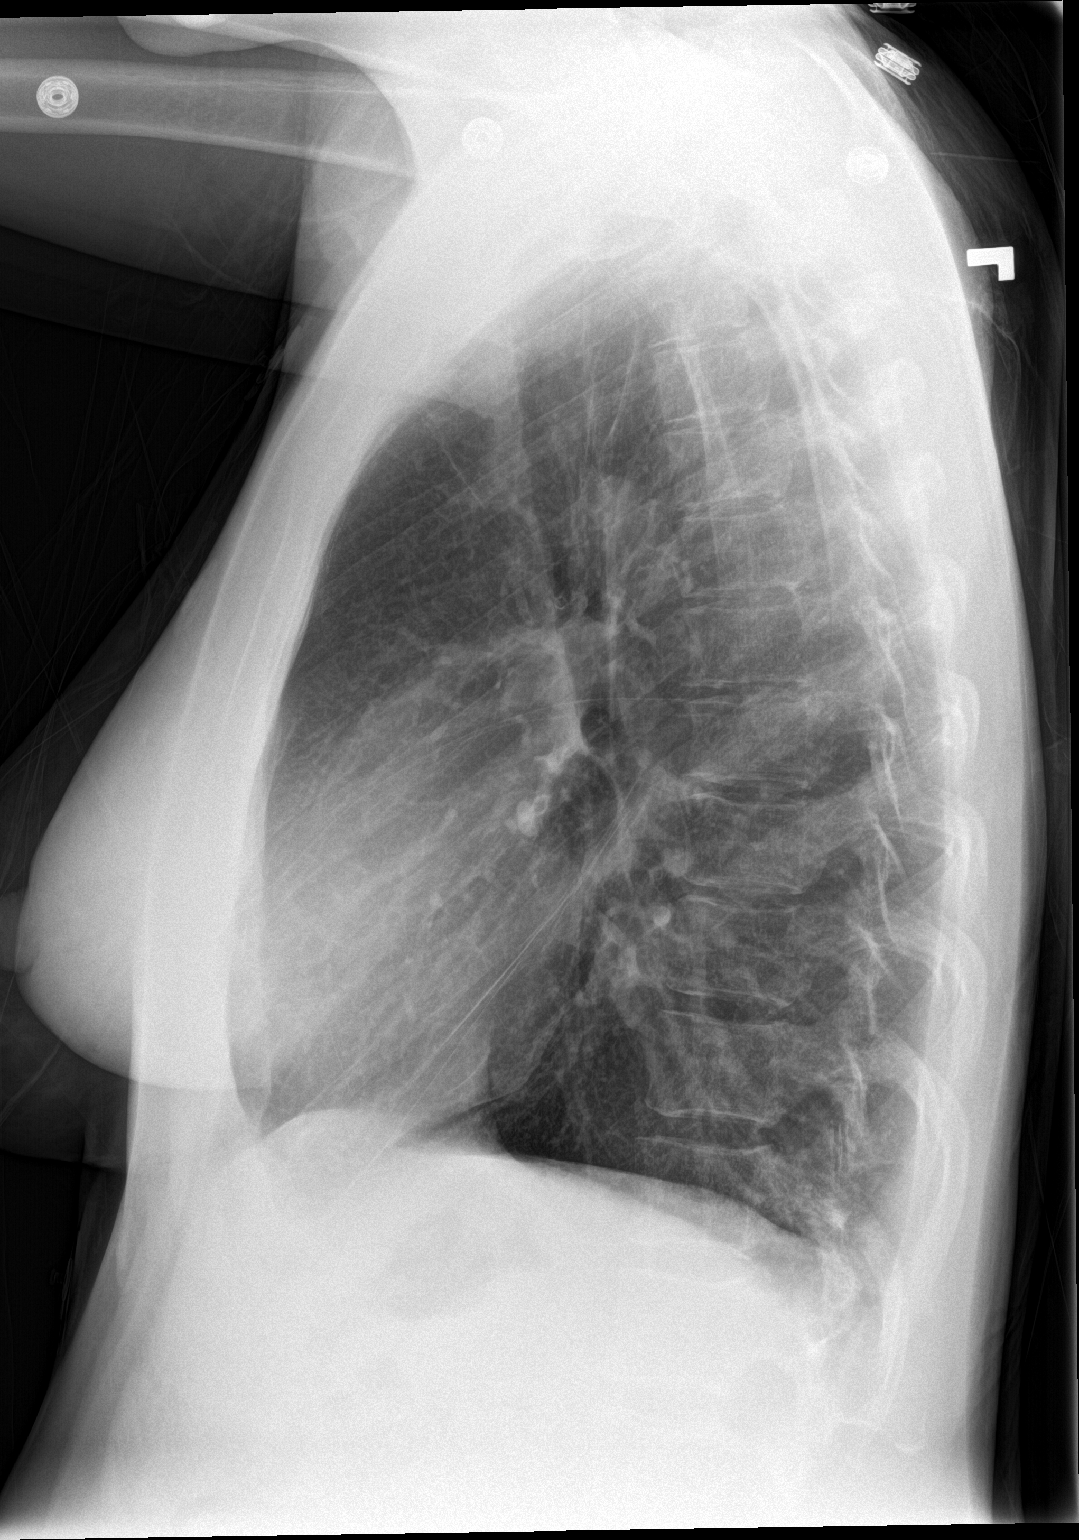

[2 of 2 positions shown; findings below may reference images not displayed]

FINDINGS: Cardiomediastinal silhouette unremarkable, unchanged. Small right
apical pneumothorax, unchanged. Surgical staples from the right
apical bullectomy procedure. Linear scar in the medial right upper
lobe, unchanged. No new pulmonary parenchymal abnormalities. Stable
emphysematous changes. Visualized bony thorax intact.
IMPRESSION: 1. Stable small right apical pneumothorax.
2. Stable scarring in the right upper lobe.
3. No new abnormalities.

## 2016-04-02 ENCOUNTER — Emergency Department (HOSPITAL_COMMUNITY)
Admission: EM | Admit: 2016-04-02 | Discharge: 2016-04-03 | Disposition: A | Discharge status: Home / Self Care | Payer: BLUE CROSS/BLUE SHIELD | Attending: Emergency Medicine | Admitting: Emergency Medicine

## 2016-04-02 ENCOUNTER — Encounter (HOSPITAL_COMMUNITY): Payer: Self-pay | Admitting: Family Medicine

## 2016-04-02 DIAGNOSIS — R197 Diarrhea, unspecified: Secondary | ICD-10-CM | POA: Diagnosis not present

## 2016-04-02 DIAGNOSIS — Z3202 Encounter for pregnancy test, result negative: Secondary | ICD-10-CM | POA: Insufficient documentation

## 2016-04-02 DIAGNOSIS — Z87891 Personal history of nicotine dependence: Secondary | ICD-10-CM | POA: Diagnosis not present

## 2016-04-02 DIAGNOSIS — R112 Nausea with vomiting, unspecified: Secondary | ICD-10-CM | POA: Insufficient documentation

## 2016-04-02 DIAGNOSIS — Z87440 Personal history of urinary (tract) infections: Secondary | ICD-10-CM | POA: Diagnosis not present

## 2016-04-02 DIAGNOSIS — R1084 Generalized abdominal pain: Secondary | ICD-10-CM | POA: Insufficient documentation

## 2016-04-02 DIAGNOSIS — Z8781 Personal history of (healed) traumatic fracture: Secondary | ICD-10-CM | POA: Diagnosis not present

## 2016-04-02 DIAGNOSIS — Z8659 Personal history of other mental and behavioral disorders: Secondary | ICD-10-CM | POA: Diagnosis not present

## 2016-04-02 DIAGNOSIS — Z79899 Other long term (current) drug therapy: Secondary | ICD-10-CM | POA: Diagnosis not present

## 2016-04-02 LAB — CBC
HCT: 47.3 % — ABNORMAL HIGH (ref 36.0–46.0)
HEMOGLOBIN: 15.7 g/dL — AB (ref 12.0–15.0)
MCH: 28.9 pg (ref 26.0–34.0)
MCHC: 33.2 g/dL (ref 30.0–36.0)
MCV: 86.9 fL (ref 78.0–100.0)
PLATELETS: 193 10*3/uL (ref 150–400)
RBC: 5.44 MIL/uL — ABNORMAL HIGH (ref 3.87–5.11)
RDW: 13 % (ref 11.5–15.5)
WBC: 10.5 10*3/uL (ref 4.0–10.5)

## 2016-04-02 LAB — COMPREHENSIVE METABOLIC PANEL
ALT: 20 U/L (ref 14–54)
AST: 21 U/L (ref 15–41)
Albumin: 4 g/dL (ref 3.5–5.0)
Alkaline Phosphatase: 58 U/L (ref 38–126)
Anion gap: 10 (ref 5–15)
BUN: 8 mg/dL (ref 6–20)
CO2: 22 mmol/L (ref 22–32)
Calcium: 9 mg/dL (ref 8.9–10.3)
Chloride: 110 mmol/L (ref 101–111)
Creatinine, Ser: 1.03 mg/dL — ABNORMAL HIGH (ref 0.44–1.00)
GFR calc Af Amer: >60 mL/min (ref >60)
GFR calc non Af Amer: >60 mL/min (ref >60)
Glucose, Bld: 115 mg/dL — ABNORMAL HIGH (ref 65–99)
Potassium: 3.9 mmol/L (ref 3.5–5.1)
Sodium: 142 mmol/L (ref 135–145)
Total Bilirubin: 0.6 mg/dL (ref 0.3–1.2)
Total Protein: 6.3 g/dL — ABNORMAL LOW (ref 6.5–8.1)

## 2016-04-02 LAB — LIPASE, BLOOD: Lipase: 13 U/L (ref 11–51)

## 2016-04-02 LAB — I-STAT BETA HCG BLOOD, ED (MC, WL, AP ONLY): I-stat hCG, quantitative: <5 m[IU]/mL (ref <5)

## 2016-04-02 MED ORDER — ONDANSETRON 4 MG PO TBDP
4.0000 mg | ORAL_TABLET | Freq: Once | ORAL | Status: AC | PRN
  Administered 2016-04-02: 4 mg via ORAL

## 2016-04-02 MED ORDER — ONDANSETRON 4 MG PO TBDP
ORAL_TABLET | ORAL | Status: AC
  Filled 2016-04-02: qty 1

## 2016-04-02 MED ORDER — ONDANSETRON HCL 4 MG/2ML IJ SOLN
4.0000 mg | Freq: Once | INTRAMUSCULAR | Status: AC
  Administered 2016-04-02: 4 mg via INTRAVENOUS
  Filled 2016-04-02: qty 2

## 2016-04-02 MED ORDER — DICYCLOMINE HCL 20 MG PO TABS
20.0000 mg | ORAL_TABLET | Freq: Once | ORAL | Status: AC
  Administered 2016-04-02: 20 mg via ORAL
  Filled 2016-04-02: qty 1

## 2016-04-02 MED ORDER — SODIUM CHLORIDE 0.9 % IV BOLUS (SEPSIS)
1000.0000 mL | Freq: Once | INTRAVENOUS | Status: AC
  Administered 2016-04-02: 1000 mL via INTRAVENOUS

## 2016-04-02 MED ORDER — ACETAMINOPHEN 500 MG PO TABS
1000.0000 mg | ORAL_TABLET | Freq: Once | ORAL | Status: AC
  Administered 2016-04-02: 1000 mg via ORAL
  Filled 2016-04-02: qty 2
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
## 2016-04-02 NOTE — ED Notes (Signed)
Pt here for diarrhea since Monday. sts this am vomiting started. sts her family is also sick.

## 2016-04-03 LAB — URINALYSIS, ROUTINE W REFLEX MICROSCOPIC
BILIRUBIN URINE: NEGATIVE
Glucose, UA: NEGATIVE mg/dL
Hgb urine dipstick: NEGATIVE
KETONES UR: 15 mg/dL — AB
LEUKOCYTES UA: NEGATIVE
NITRITE: NEGATIVE
PH: 6 (ref 5.0–8.0)
Protein, ur: NEGATIVE mg/dL
SPECIFIC GRAVITY, URINE: 1.022 (ref 1.005–1.030)

## 2016-04-03 MED ORDER — PROMETHAZINE HCL 25 MG PO TABS: 25.0000 mg | ORAL_TABLET | Freq: Four times a day (QID) | ORAL | Status: DC | PRN

## 2016-04-03 MED ORDER — DICYCLOMINE HCL 20 MG PO TABS: 20.0000 mg | ORAL_TABLET | Freq: Three times a day (TID) | ORAL | Status: DC

## 2016-04-03 NOTE — Discharge Instructions (Signed)
Take tylenol or motrin for your fever and pain. You may alternate the medication every 3 hours. Continue to drink fluids. You may take over the counter imodium for diarrhea relief.  Nausea and Vomiting Nausea is a sick feeling that often comes before throwing up (vomiting). Vomiting is a reflex where stomach contents come out of your mouth. Vomiting can cause severe loss of body fluids (dehydration). Children and elderly adults can become dehydrated quickly, especially if they also have diarrhea. Nausea and vomiting are symptoms of a condition or disease. It is important to find the cause of your symptoms. CAUSES   Direct irritation of the stomach lining. This irritation can result from increased acid production (gastroesophageal reflux disease), infection, food poisoning, taking certain medicines (such as nonsteroidal anti-inflammatory drugs), alcohol use, or tobacco use.  Signals from the brain.These signals could be caused by a headache, heat exposure, an inner ear disturbance, increased pressure in the brain from injury, infection, a tumor, or a concussion, pain, emotional stimulus, or metabolic problems.  An obstruction in the gastrointestinal tract (bowel obstruction).  Illnesses such as diabetes, hepatitis, gallbladder problems, appendicitis, kidney problems, cancer, sepsis, atypical symptoms of a heart attack, or eating disorders.  Medical treatments such as chemotherapy and radiation.  Receiving medicine that makes you sleep (general anesthetic) during surgery. DIAGNOSIS Your caregiver may ask for tests to be done if the problems do not improve after a few days. Tests may also be done if symptoms are severe or if the reason for the nausea and vomiting is not clear. Tests may include:  Urine tests.  Blood tests.  Stool tests.  Cultures (to look for evidence of infection).  X-rays or other imaging studies. Test results can help your caregiver make decisions about treatment or  the need for additional tests. TREATMENT You need to stay well hydrated. Drink frequently but in small amounts.You may wish to drink water, sports drinks, clear broth, or eat frozen ice pops or gelatin dessert to help stay hydrated.When you eat, eating slowly may help prevent nausea.There are also some antinausea medicines that may help prevent nausea. HOME CARE INSTRUCTIONS   Take all medicine as directed by your caregiver.  If you do not have an appetite, do not force yourself to eat. However, you must continue to drink fluids.  If you have an appetite, eat a normal diet unless your caregiver tells you differently.  Eat a variety of complex carbohydrates (rice, wheat, potatoes, bread), lean meats, yogurt, fruits, and vegetables.  Avoid high-fat foods because they are more difficult to digest.  Drink enough water and fluids to keep your urine clear or pale yellow.  If you are dehydrated, ask your caregiver for specific rehydration instructions. Signs of dehydration may include:  Severe thirst.  Dry lips and mouth.  Dizziness.  Dark urine.  Decreasing urine frequency and amount.  Confusion.  Rapid breathing or pulse. SEEK IMMEDIATE MEDICAL CARE IF:   You have blood or brown flecks (like coffee grounds) in your vomit.  You have black or bloody stools.  You have a severe headache or stiff neck.  You are confused.  You have severe abdominal pain.  You have chest pain or trouble breathing.  You do not urinate at least once every 8 hours.  You develop cold or clammy skin.  You continue to vomit for longer than 24 to 48 hours.  You have a fever. MAKE SURE YOU:   Understand these instructions.  Will watch your condition.  Will get  help right away if you are not doing well or get worse.   This information is not intended to replace advice given to you by your health care provider. Make sure you discuss any questions you have with your health care provider.     Document Released: 12/15/2005 Document Revised: 03/08/2012 Document Reviewed: 05/14/2011 Elsevier Interactive Patient Education [employer]2016 Elsevier Inc.

## 2016-05-11 ENCOUNTER — Emergency Department (HOSPITAL_COMMUNITY): Payer: BLUE CROSS/BLUE SHIELD

## 2016-05-11 ENCOUNTER — Emergency Department (HOSPITAL_COMMUNITY)
Admission: EM | Admit: 2016-05-11 | Discharge: 2016-05-12 | Disposition: A | Discharge status: Home / Self Care | Payer: BLUE CROSS/BLUE SHIELD | Attending: Emergency Medicine | Admitting: Emergency Medicine

## 2016-05-11 ENCOUNTER — Encounter (HOSPITAL_COMMUNITY): Payer: Self-pay

## 2016-05-11 DIAGNOSIS — Z7982 Long term (current) use of aspirin: Secondary | ICD-10-CM | POA: Diagnosis not present

## 2016-05-11 DIAGNOSIS — Z87440 Personal history of urinary (tract) infections: Secondary | ICD-10-CM | POA: Insufficient documentation

## 2016-05-11 DIAGNOSIS — S6991XA Unspecified injury of right wrist, hand and finger(s), initial encounter: Secondary | ICD-10-CM | POA: Diagnosis present

## 2016-05-11 DIAGNOSIS — Y9389 Activity, other specified: Secondary | ICD-10-CM | POA: Diagnosis not present

## 2016-05-11 DIAGNOSIS — Z8659 Personal history of other mental and behavioral disorders: Secondary | ICD-10-CM | POA: Insufficient documentation

## 2016-05-11 DIAGNOSIS — X58XXXA Exposure to other specified factors, initial encounter: Secondary | ICD-10-CM | POA: Insufficient documentation

## 2016-05-11 DIAGNOSIS — Y998 Other external cause status: Secondary | ICD-10-CM | POA: Diagnosis not present

## 2016-05-11 DIAGNOSIS — Z79899 Other long term (current) drug therapy: Secondary | ICD-10-CM | POA: Insufficient documentation

## 2016-05-11 DIAGNOSIS — Z87891 Personal history of nicotine dependence: Secondary | ICD-10-CM | POA: Diagnosis not present

## 2016-05-11 DIAGNOSIS — L989 Disorder of the skin and subcutaneous tissue, unspecified: Secondary | ICD-10-CM | POA: Insufficient documentation

## 2016-05-11 DIAGNOSIS — S6391XA Sprain of unspecified part of right wrist and hand, initial encounter: Secondary | ICD-10-CM | POA: Diagnosis not present

## 2016-05-11 DIAGNOSIS — Y9289 Other specified places as the place of occurrence of the external cause: Secondary | ICD-10-CM | POA: Insufficient documentation

## 2016-05-11 DIAGNOSIS — Z8709 Personal history of other diseases of the respiratory system: Secondary | ICD-10-CM | POA: Diagnosis not present

## 2016-05-11 MED ORDER — METHOCARBAMOL 500 MG PO TABS: 500.0000 mg | ORAL_TABLET | Freq: Two times a day (BID) | ORAL | Status: DC

## 2016-05-11 MED ORDER — ACETAMINOPHEN-CODEINE #3 300-30 MG PO TABS: 1.0000 | ORAL_TABLET | Freq: Four times a day (QID) | ORAL | Status: DC | PRN

## 2016-05-11 NOTE — ED Notes (Signed)
Pt here for hand injury to right wrist after picking up carseat.

## 2016-05-11 NOTE — ED Notes (Addendum)
Patient reports that while in radiology, the X-Ray table was lowered on to her right thigh.  RN noted that the imprint of the table is present. Patient  C/O pain. No bruising is noted but area is red. MD notified.
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
## 2016-05-11 NOTE — Discharge Instructions (Signed)
Intermetacarpal Sprain °The intermetacarpal ligaments run between the knuckles at the base of the fingers. These ligaments are vulnerable to sprain and injury in which the ligament becomes overstretched or torn. Intermetacarpal sprains are classified into 3 categories. Grade 1 sprains cause pain, but the tendon is not lengthened. Grade 2 sprains include a lengthened ligament, due to the ligament being stretched or partially ruptured. With grade 2 sprains there is still function, although function may be decreased. Grade 3 sprains include a complete tear of the ligament, and the joint usually displays a loss of function.  °SYMPTOMS  °· Severe pain at the time of injury. °· Often, a feeling of popping or tearing inside the hand. °· Tenderness and inflammation at the knuckles. °· Bruising within a couple days of injury. °· Impaired ability to use the hand. °CAUSES  °This condition occurs when the intermetacarpal ligaments are subjected to a greater stress than they can handle. This causes the ligaments to become stretched or torn. °RISK INCREASES WITH: °· Previous hand injury. °· Fighting sports (boxing, wrestling, martial arts). °· Sports in which you could fall on an outstretched hand (soccer, basketball, volleyball). °· Other sports with repeated hand trauma (water polo, gymnastics). °· Poor hand strength and flexibility. °· Inadequate or poorly fitted protective equipment. °PREVENTION  °· Warm up and stretch properly before activity. °· Maintain appropriate conditioning: °¨ Hand flexibility. °¨ Muscle strength and endurance. °· Applying tape, protective strapping, or a brace may help prevent injury. °· Provide the hand with support during sports and practice activities for 6 to 12 months following injury. °PROGNOSIS  °With proper treatment, healing should occur without impairment. The length of healing varies from 2 to 12 weeks, depending on the severity of injury. °RELATED COMPLICATIONS  °· Longer healing time, if  activities are resumed too soon. °· Recurring symptoms or repeated injury, resulting in a chronic problem. °· Injury to other nearby structures (bone, cartilage, tendon). °· Arthritis of the knuckle (intermetacarpal) joint, with repeated sprains. °· Prolonged disability (sometimes). °· Hand and finger stiffness or weakness. °TREATMENT °Treatment first involves ice and medicine to reduce pain and inflammation. An elastic compression bandage may be worn to reduce discomfort and to protect the area. Depending on the severity of injury, you may be required to restrain the area with a cast, splint, or brace. After the ligament has been allowed to heal, strengthening and stretching exercises may be needed to regain strength and a full range of motion. Exercises may be completed at home or with a therapist. Surgery is rarely needed. °MEDICATION  °· If pain medicine is needed, nonsteroidal anti-inflammatory medicines (aspirin and ibuprofen), or other minor pain relievers (acetaminophen), are often advised. °· Do not take pain medicine for 7 days before surgery. °· Stronger pain relievers may be prescribed if your caregiver thinks they are needed. Use only as directed and only as much as you need. °HEAT AND COLD °· Cold treatment (icing) should be applied for 10 to 15 minutes every 2 to 3 hours for inflammation and pain, and immediately after activity that aggravates your symptoms. Use ice packs or an ice massage. °· Heat treatment may be used before performing stretching and strengthening activities prescribed by your caregiver, physical therapist, or athletic trainer. Use a heat pack or a warm water soak. °SEEK MEDICAL CARE IF:  °· Symptoms remain or get worse, despite treatment for longer than 2 to 4 weeks. °· You experience pain, numbness, discoloration, or coldness in the hand or fingers. °·   You develop blue, gray, or dark fingernails. °· Any of the following occur after surgery: increased pain, swelling, redness,  drainage of fluids, bleeding in the affected area, or signs of infection, including fever. °· New, unexplained symptoms develop. (Drugs used in treatment may produce side effects.) °  °This information is not intended to replace advice given to you by your health care provider. Make sure you discuss any questions you have with your health care provider. °  °Document Released: 12/15/2005 Document Revised: 01/05/2015 Document Reviewed: 06/04/2015 °Elsevier Interactive Patient Education ©2016 Elsevier Inc. ° °

## 2016-05-12 NOTE — ED Notes (Signed)
Discharge instructions and prescriptions reviewed - voiced understanding 

## 2016-06-04 ENCOUNTER — Encounter (HOSPITAL_COMMUNITY): Payer: Self-pay | Admitting: Vascular Surgery

## 2016-06-04 ENCOUNTER — Emergency Department (HOSPITAL_COMMUNITY): Payer: BLUE CROSS/BLUE SHIELD

## 2016-06-04 ENCOUNTER — Emergency Department (HOSPITAL_COMMUNITY)
Admission: EM | Admit: 2016-06-04 | Discharge: 2016-06-04 | Disposition: A | Discharge status: Home / Self Care | Payer: BLUE CROSS/BLUE SHIELD | Attending: Emergency Medicine | Admitting: Emergency Medicine

## 2016-06-04 DIAGNOSIS — Z79899 Other long term (current) drug therapy: Secondary | ICD-10-CM | POA: Diagnosis not present

## 2016-06-04 DIAGNOSIS — Z87440 Personal history of urinary (tract) infections: Secondary | ICD-10-CM | POA: Insufficient documentation

## 2016-06-04 DIAGNOSIS — Z8659 Personal history of other mental and behavioral disorders: Secondary | ICD-10-CM | POA: Diagnosis not present

## 2016-06-04 DIAGNOSIS — Z8781 Personal history of (healed) traumatic fracture: Secondary | ICD-10-CM | POA: Insufficient documentation

## 2016-06-04 DIAGNOSIS — R197 Diarrhea, unspecified: Secondary | ICD-10-CM | POA: Diagnosis not present

## 2016-06-04 DIAGNOSIS — H9201 Otalgia, right ear: Secondary | ICD-10-CM | POA: Diagnosis not present

## 2016-06-04 DIAGNOSIS — Z87891 Personal history of nicotine dependence: Secondary | ICD-10-CM | POA: Insufficient documentation

## 2016-06-04 DIAGNOSIS — J40 Bronchitis, not specified as acute or chronic: Secondary | ICD-10-CM | POA: Insufficient documentation

## 2016-06-04 DIAGNOSIS — R05 Cough: Secondary | ICD-10-CM | POA: Diagnosis present

## 2016-06-04 LAB — URINALYSIS, ROUTINE W REFLEX MICROSCOPIC
Bilirubin Urine: NEGATIVE
Glucose, UA: NEGATIVE mg/dL
Hgb urine dipstick: NEGATIVE
Ketones, ur: NEGATIVE mg/dL
LEUKOCYTES UA: NEGATIVE
NITRITE: NEGATIVE
Protein, ur: NEGATIVE mg/dL
SPECIFIC GRAVITY, URINE: 1.009 (ref 1.005–1.030)
pH: 6.5 (ref 5.0–8.0)

## 2016-06-04 LAB — COMPREHENSIVE METABOLIC PANEL
ALBUMIN: 3.3 g/dL — AB (ref 3.5–5.0)
ALK PHOS: 66 U/L (ref 38–126)
ALT: 17 U/L (ref 14–54)
AST: 18 U/L (ref 15–41)
Anion gap: 7 (ref 5–15)
BUN: <5 mg/dL — ABNORMAL LOW (ref 6–20)
CALCIUM: 8.5 mg/dL — AB (ref 8.9–10.3)
CHLORIDE: 107 mmol/L (ref 101–111)
CO2: 23 mmol/L (ref 22–32)
CREATININE: 0.99 mg/dL (ref 0.44–1.00)
GFR calc non Af Amer: >60 mL/min (ref >60)
GLUCOSE: 126 mg/dL — AB (ref 65–99)
Potassium: 3.4 mmol/L — ABNORMAL LOW (ref 3.5–5.1)
SODIUM: 137 mmol/L (ref 135–145)
Total Bilirubin: 0.2 mg/dL — ABNORMAL LOW (ref 0.3–1.2)
Total Protein: 5.5 g/dL — ABNORMAL LOW (ref 6.5–8.1)

## 2016-06-04 LAB — CBC
HCT: 42.3 % (ref 36.0–46.0)
HEMOGLOBIN: 14 g/dL (ref 12.0–15.0)
MCH: 27.9 pg (ref 26.0–34.0)
MCHC: 33.1 g/dL (ref 30.0–36.0)
MCV: 84.3 fL (ref 78.0–100.0)
Platelets: 197 10*3/uL (ref 150–400)
RBC: 5.02 MIL/uL (ref 3.87–5.11)
RDW: 13.3 % (ref 11.5–15.5)
WBC: 8.4 10*3/uL (ref 4.0–10.5)

## 2016-06-04 LAB — RAPID STREP SCREEN (MED CTR MEBANE ONLY): STREPTOCOCCUS, GROUP A SCREEN (DIRECT): NEGATIVE

## 2016-06-04 MED ORDER — IBUPROFEN 400 MG PO TABS
ORAL_TABLET | ORAL | Status: AC
  Filled 2016-06-04: qty 1

## 2016-06-04 MED ORDER — ONDANSETRON 4 MG PO TBDP: 4.0000 mg | ORAL_TABLET | Freq: Three times a day (TID) | ORAL | Status: DC | PRN

## 2016-06-04 MED ORDER — HYDROCOD POLST-CPM POLST ER 10-8 MG/5ML PO SUER
5.0000 mL | Freq: Once | ORAL | Status: AC
  Administered 2016-06-04: 5 mL via ORAL
  Filled 2016-06-04: qty 5

## 2016-06-04 MED ORDER — PROMETHAZINE-PHENYLEPHRINE 6.25-5 MG/5ML PO SYRP: 5.0000 mL | ORAL_SOLUTION | ORAL | Status: DC | PRN

## 2016-06-04 MED ORDER — ONDANSETRON 4 MG PO TBDP
ORAL_TABLET | ORAL | Status: AC
  Filled 2016-06-04: qty 1

## 2016-06-04 MED ORDER — IBUPROFEN 200 MG PO TABS
ORAL_TABLET | ORAL | Status: AC
  Filled 2016-06-04: qty 1

## 2016-06-04 MED ORDER — IBUPROFEN 400 MG PO TABS
600.0000 mg | ORAL_TABLET | Freq: Once | ORAL | Status: AC
  Administered 2016-06-04: 600 mg via ORAL

## 2016-06-04 MED ORDER — ALBUTEROL SULFATE HFA 108 (90 BASE) MCG/ACT IN AERS
1.0000 | INHALATION_SPRAY | Freq: Four times a day (QID) | RESPIRATORY_TRACT | Status: DC | PRN
  Administered 2016-06-04: 2 via RESPIRATORY_TRACT
  Filled 2016-06-04: qty 6.7

## 2016-06-04 MED ORDER — ONDANSETRON 4 MG PO TBDP
4.0000 mg | ORAL_TABLET | Freq: Once | ORAL | Status: AC | PRN
  Administered 2016-06-04: 4 mg via ORAL
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
## 2016-06-04 NOTE — ED Notes (Signed)
Pt reports to the ED for eval of influenza like illness x 4 days. Pt reports her kids had runny noses on Saturday and she was exposed to them and now she is having runny nose, productive green cough, HA, sore throat, body aches, and right sided ear ache. She also reports generalized body aches. Reports some associated N/D and denies any emesis. Pt A&Ox4, resp e/u, and skin warm and dry.

## 2016-06-04 NOTE — ED Notes (Signed)
Took 2 regular strengthTylenol  at 16:00. Last Motrin was at approx 7:30-8 am.

## 2016-06-07 LAB — CULTURE, GROUP A STREP (THRC)

## 2016-10-18 IMAGING — RF DG CHOLANGIOGRAM OPERATIVE
1 series · 5 of 5 positions shown · non-contrast
Comparison: None.

CLINICAL DATA: 29-year-old female with a history of cholelithiasis

EXAM:
INTRAOPERATIVE CHOLANGIOGRAM
TECHNIQUE: Cholangiographic images from the C-arm fluoroscopic device were
submitted for interpretation post-operatively. Please see the
procedural report for the amount of contrast and the fluoroscopy
time utilized.

[Series 1: run · 2 acquisitions, 5 frames shown]
[im 1/2]
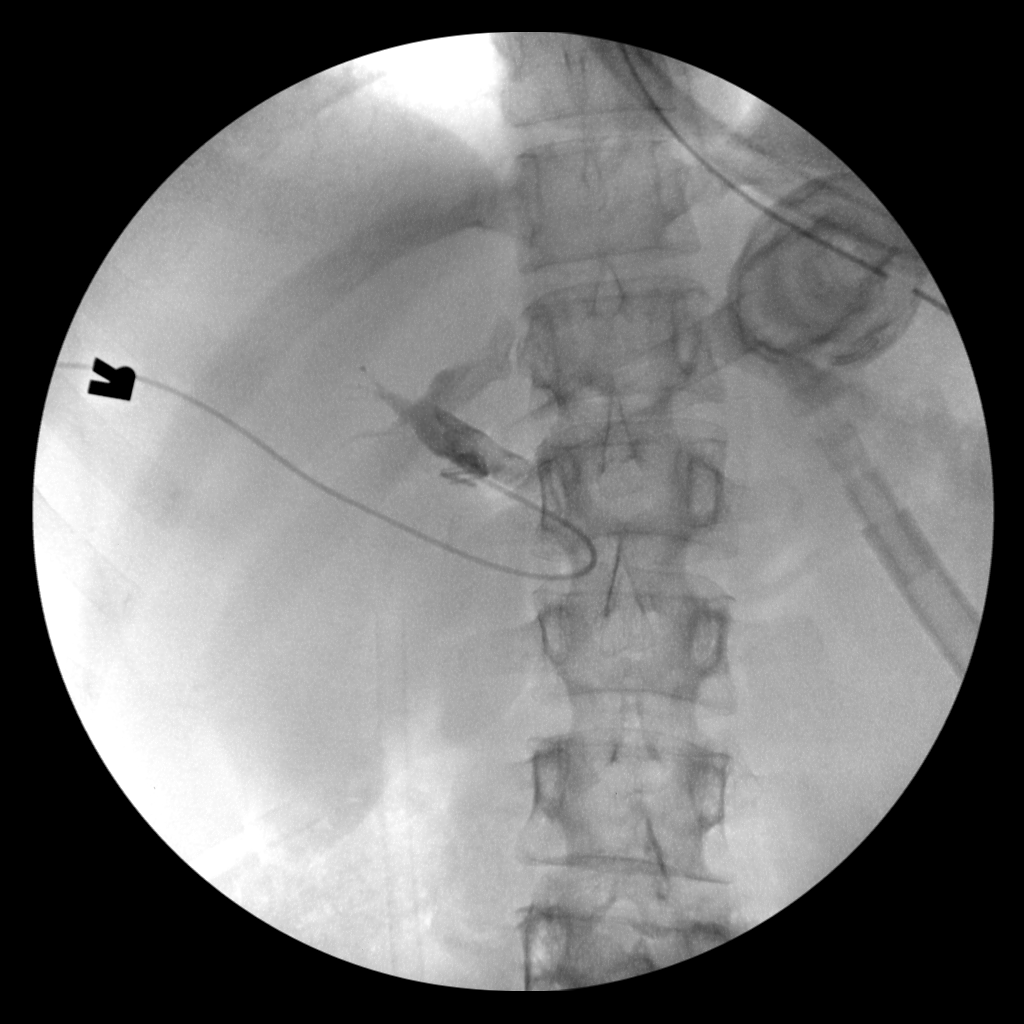
[im 1/2]
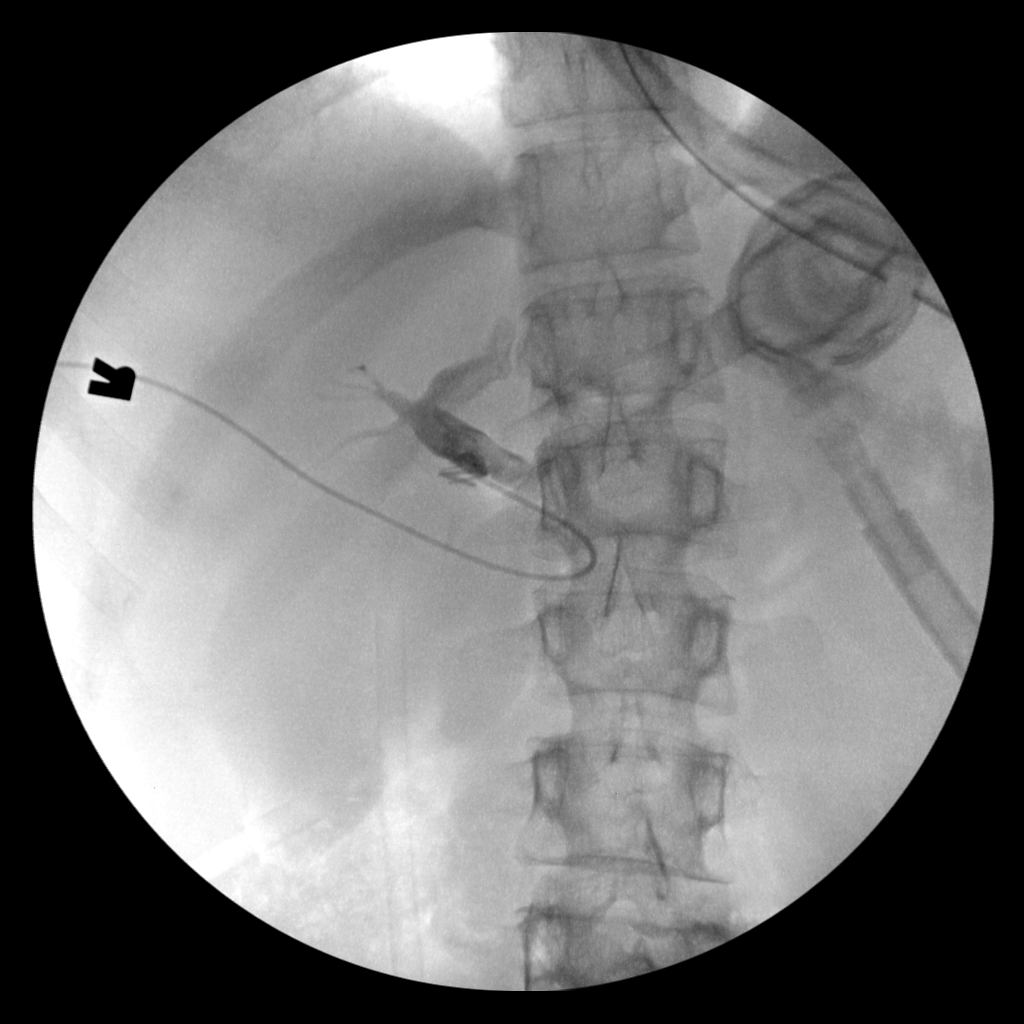
[im 1/2]
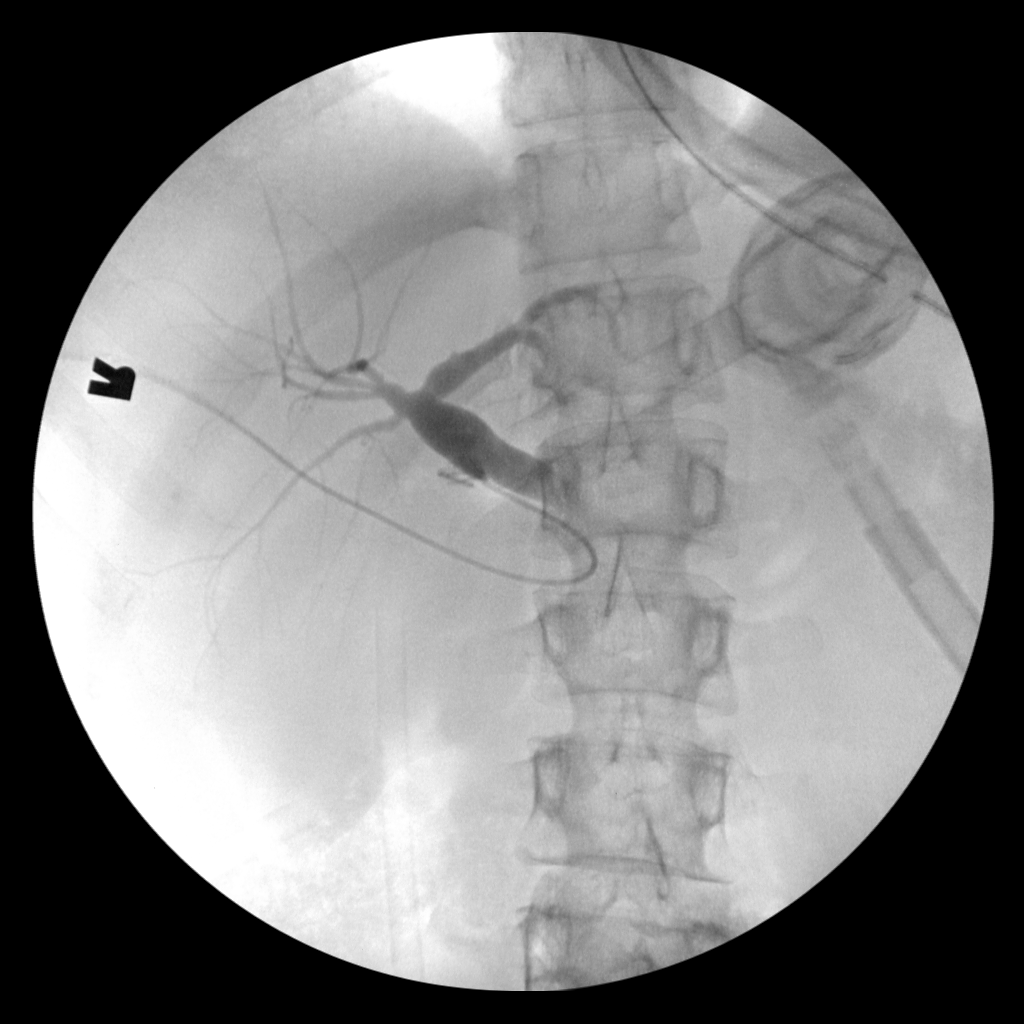
[im 1/2]
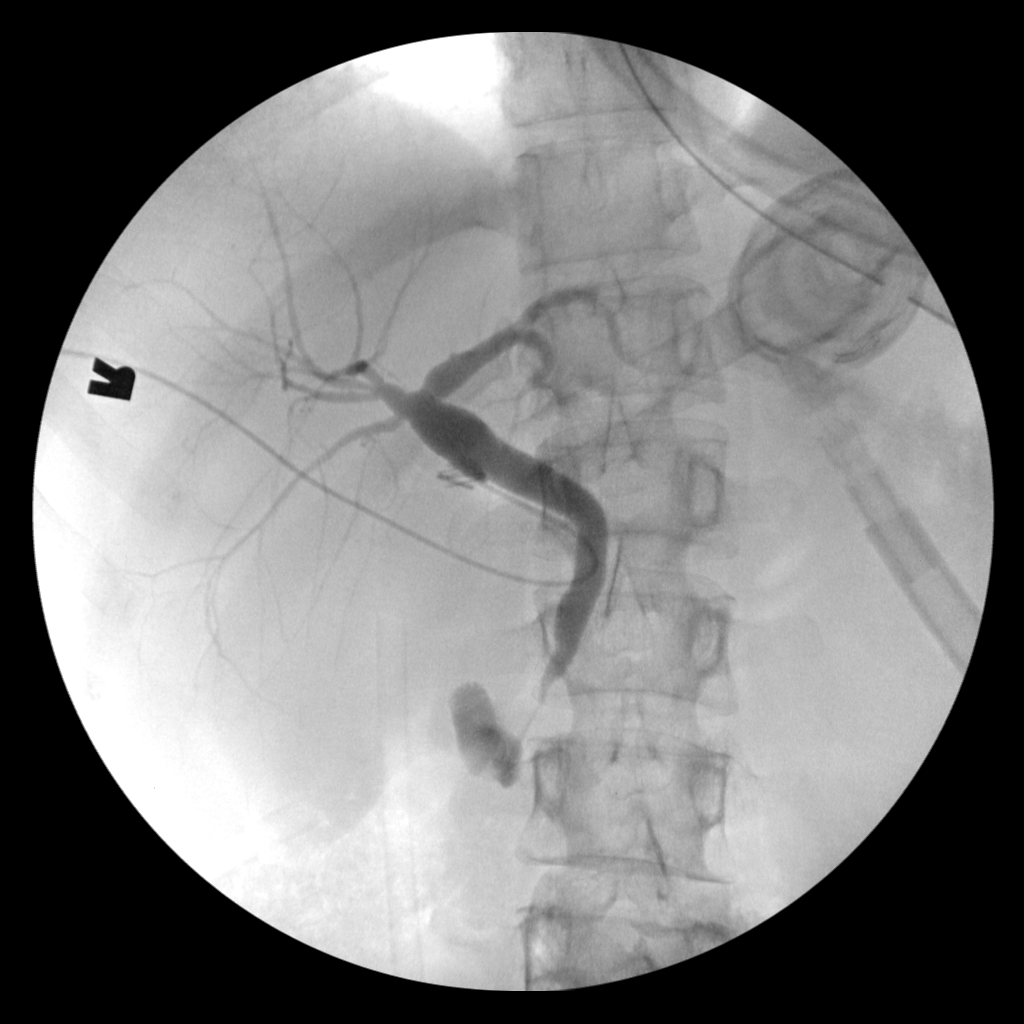
[im 2/2]
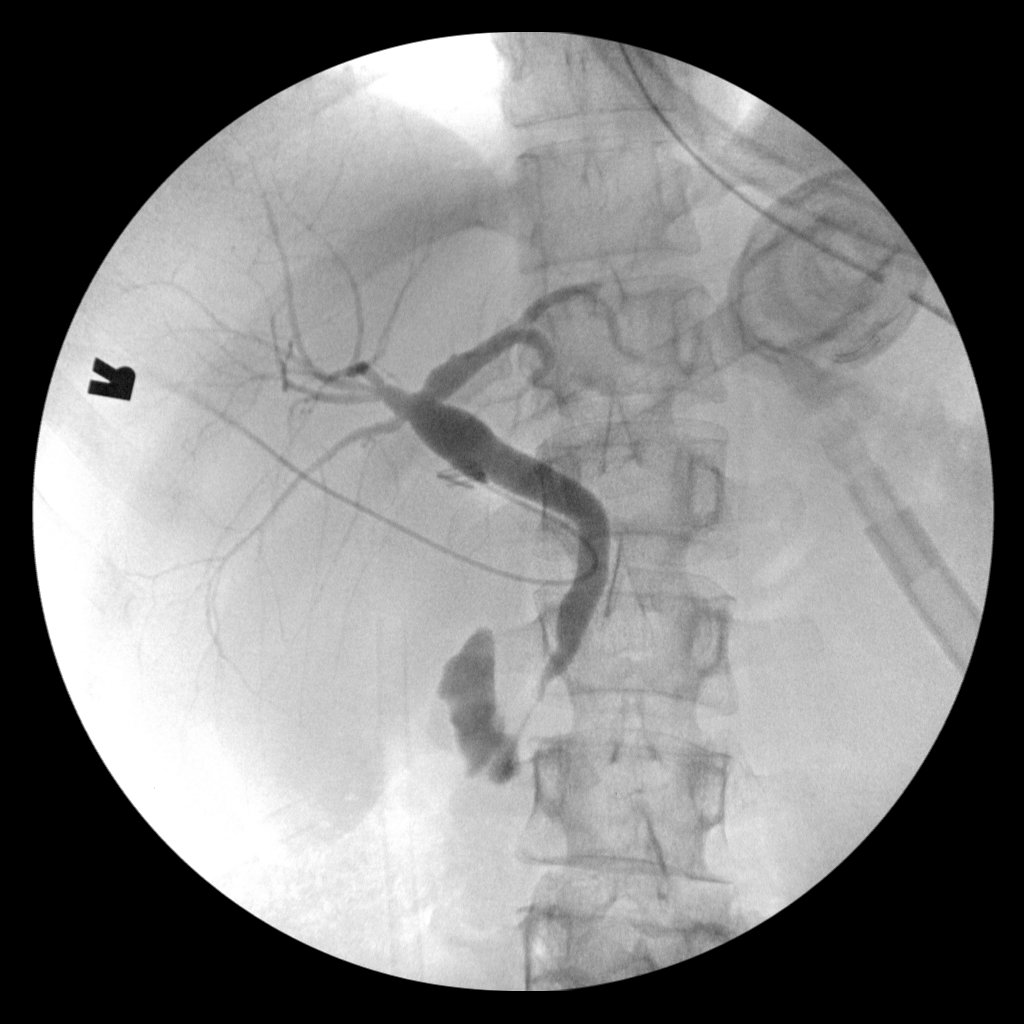

[5 of 5 positions shown; findings below may reference images not displayed]

FINDINGS: Surgical instruments project over the upper abdomen.

There is cannulation of the cystic duct/gallbladder neck, with
antegrade infusion of contrast. Caliber of the extrahepatic ductal
system within normal limits.

No large filling defect identified.

Free flow of contrast across the ampulla.
IMPRESSION: Intraoperative cholangiogram demonstrates extrahepatic biliary ducts
of unremarkable caliber, with no large filling defect identified.
Free flow of contrast across the ampulla.

Please refer to the dictated operative report for full details of
intraoperative findings and procedure

## 2016-12-29 IMAGING — US US ABDOMEN LIMITED
1 series · 14 of 25 positions shown · non-contrast
Comparison: A noncontrast abdominal and pelvic CT scan of today's
date

CLINICAL DATA: Gallstones demonstrated on CT scan of today's date

EXAM:
US ABDOMEN LIMITED - RIGHT UPPER QUADRANT

[Series 1: us abdomen limited · 0.19mm/px · 14 of 48 slices shown]
[im 1/48]
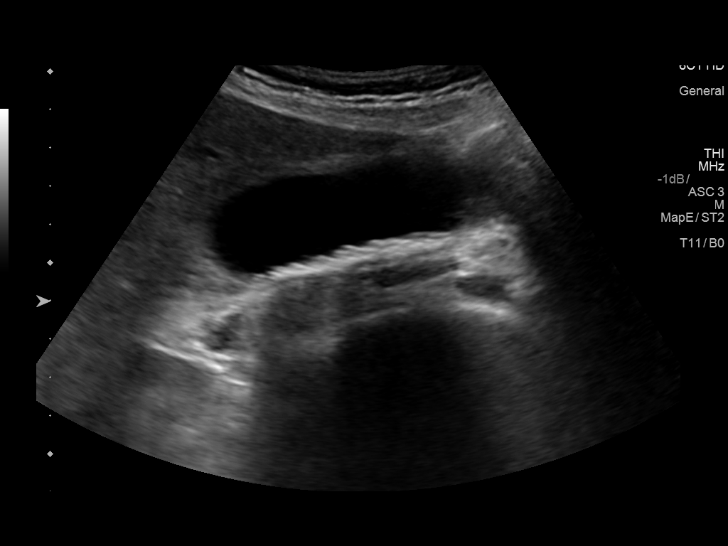
[im 4/48]
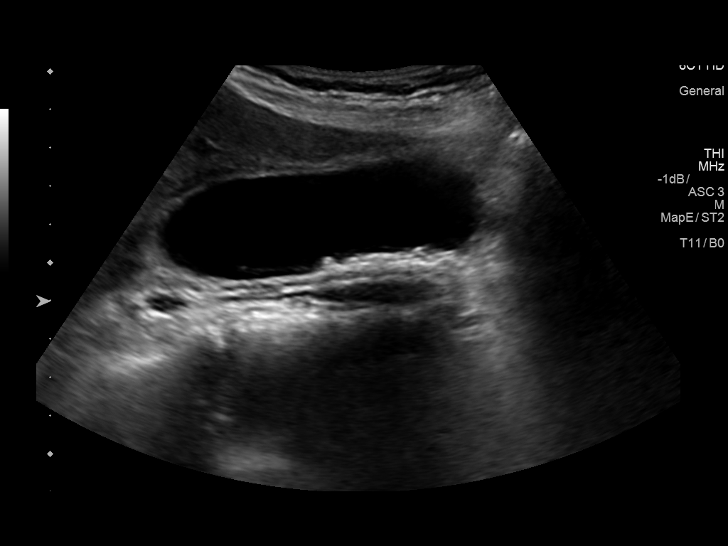
[im 8/48]
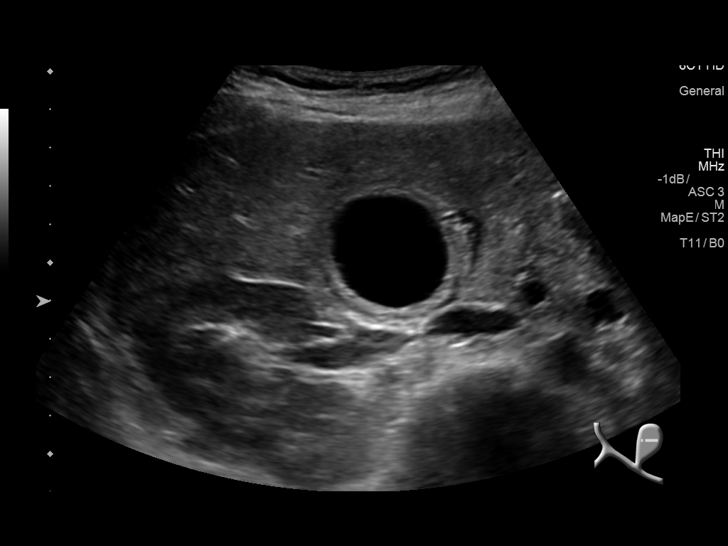
[im 12/48]
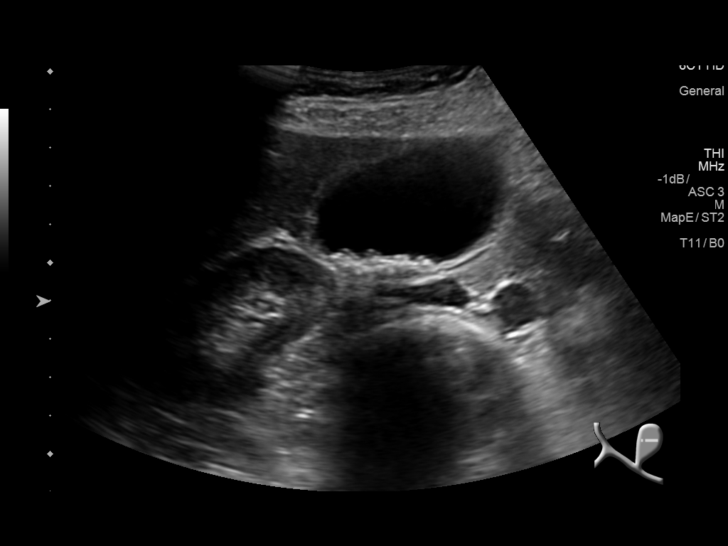
[im 16/48]
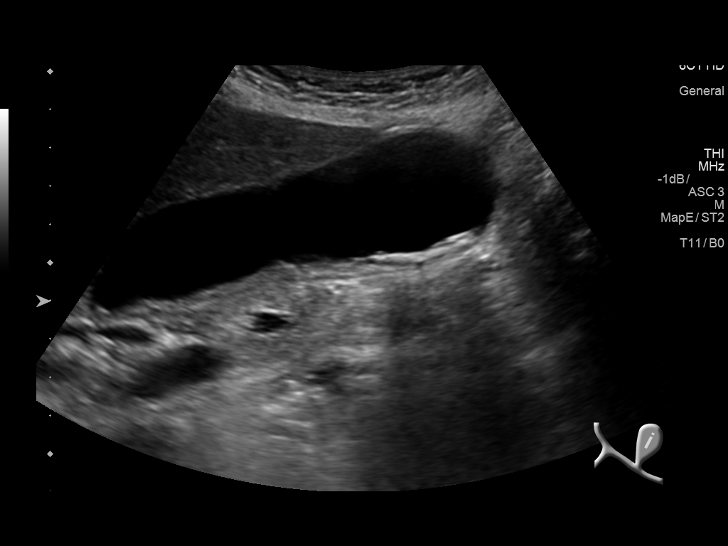
[im 18/48]
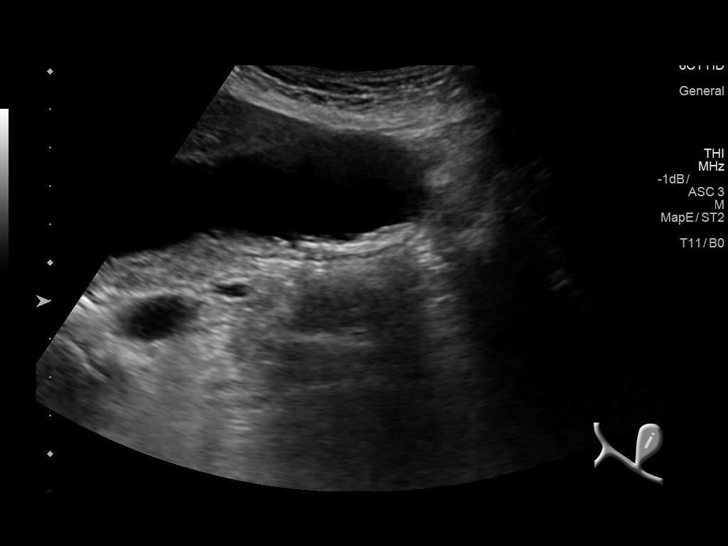
[im 22/48]
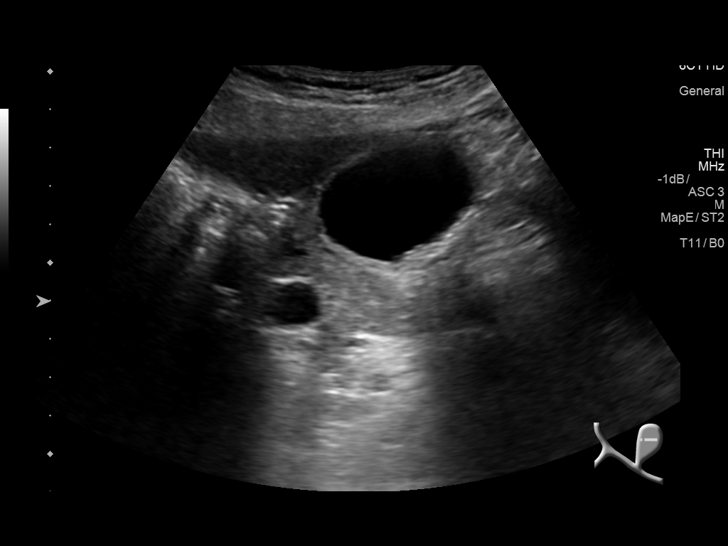
[im 26/48]
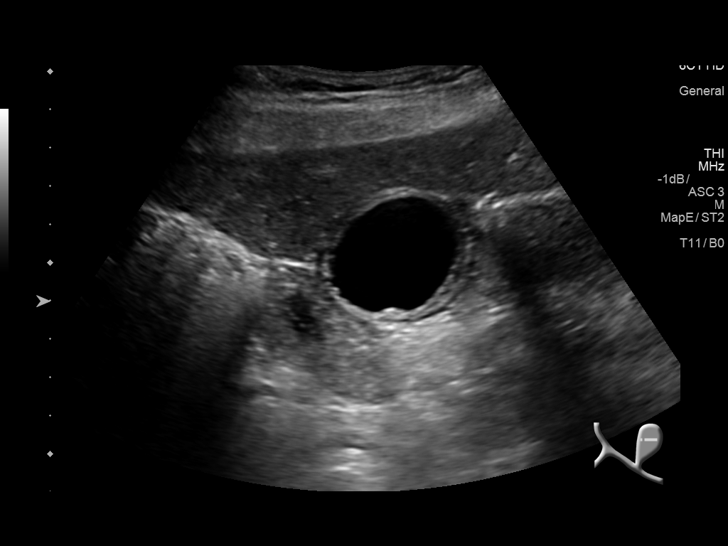
[im 30/48]
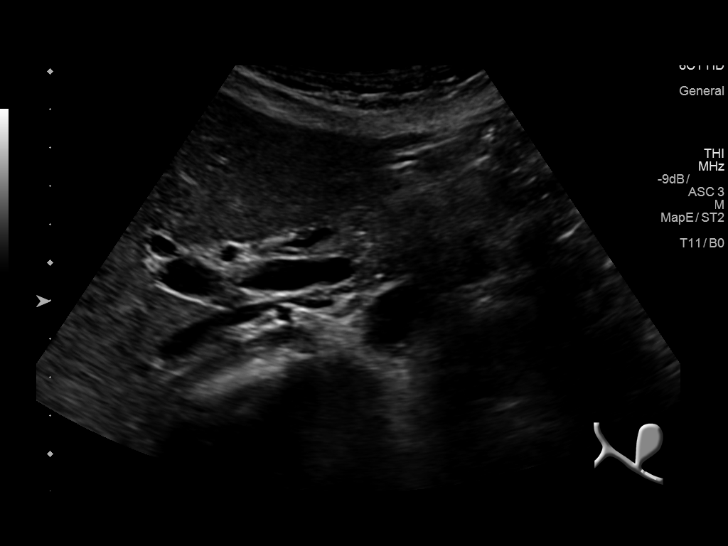
[im 32/48]
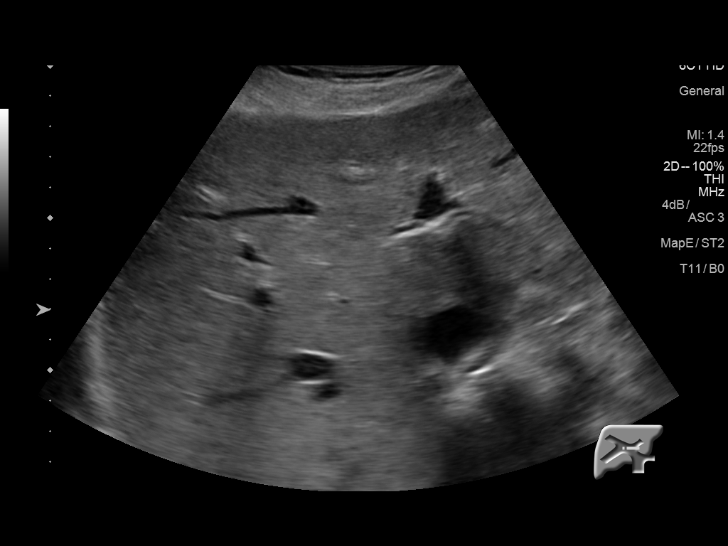
[im 36/48]
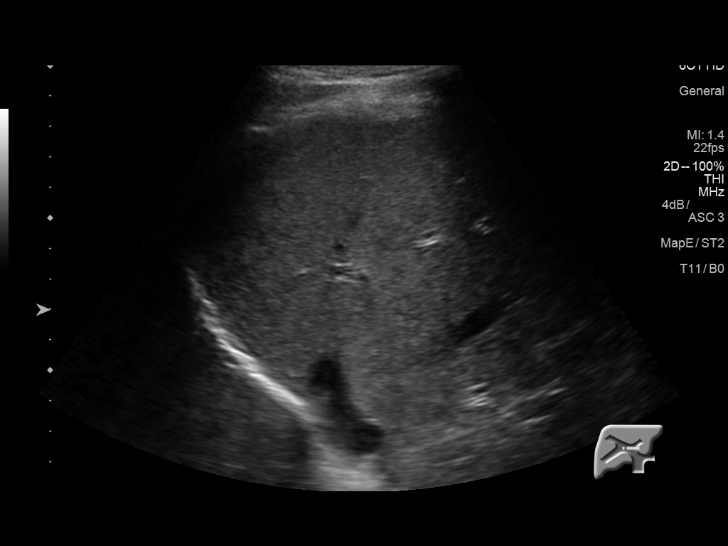
[im 40/48]
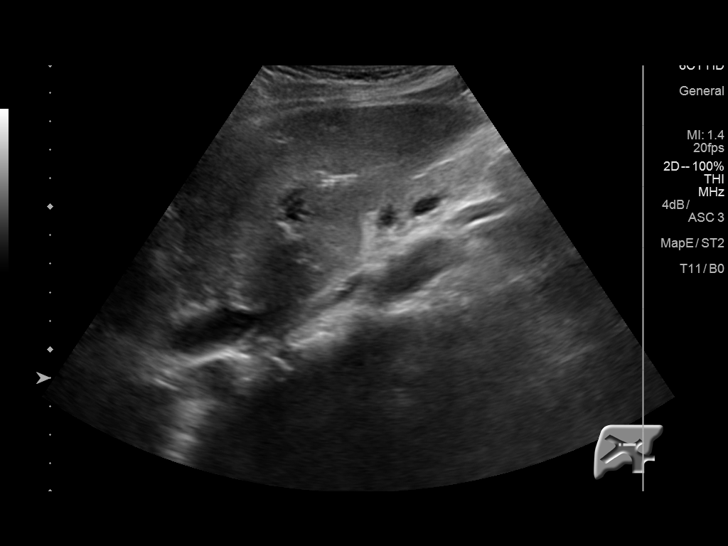
[im 44/48]
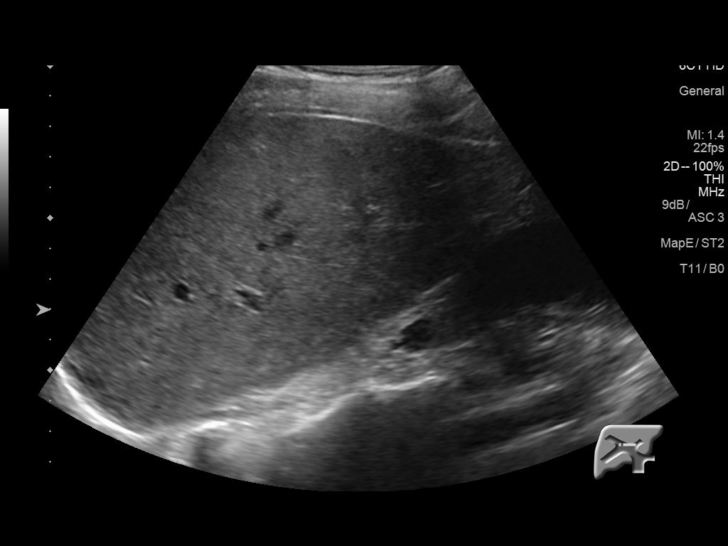
[im 48/48]
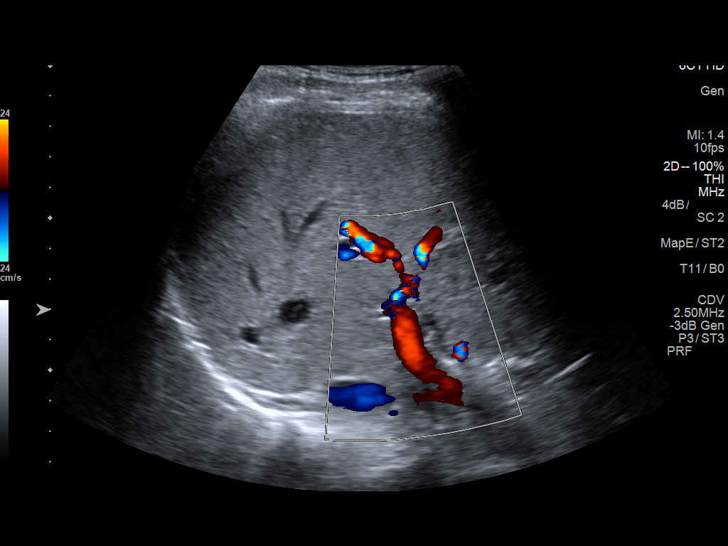

[14 of 25 positions shown; findings below may reference images not displayed]

FINDINGS: Gallbladder:

The gallbladder is mildly distended. There are multiple tiny mobile
gallstones. There is no gallbladder wall thickening. There trace of
pericholecystic fluid. There is no positive sonographic Murphy's
sign.

Common bile duct:

Diameter: 6.8 mm. No abnormal intraluminal filling defects are
observed in the common bile duct.

Liver:

The hepatic echotexture is normal. There is no focal mass nor ductal
dilation.
IMPRESSION: Multiple tiny gallstones associated with gallbladder wall
thickening, pericholecystic fluid, and gallbladder distention. This
may reflect acute or subacute cholecystitis. No positive sonographic
Murphy's sign was observed however.

No common bile duct stones are demonstrated.

## 2017-01-08 ENCOUNTER — Emergency Department (HOSPITAL_COMMUNITY)
Admission: EM | Admit: 2017-01-08 | Discharge: 2017-01-09 | Disposition: A | Discharge status: Home / Self Care | Payer: BLUE CROSS/BLUE SHIELD | Attending: Emergency Medicine | Admitting: Emergency Medicine

## 2017-01-08 ENCOUNTER — Emergency Department (HOSPITAL_COMMUNITY): Payer: BLUE CROSS/BLUE SHIELD

## 2017-01-08 ENCOUNTER — Encounter (HOSPITAL_COMMUNITY): Payer: Self-pay

## 2017-01-08 ENCOUNTER — Other Ambulatory Visit: Payer: Self-pay

## 2017-01-08 DIAGNOSIS — J438 Other emphysema: Secondary | ICD-10-CM | POA: Diagnosis not present

## 2017-01-08 DIAGNOSIS — Z79899 Other long term (current) drug therapy: Secondary | ICD-10-CM | POA: Diagnosis not present

## 2017-01-08 DIAGNOSIS — R0789 Other chest pain: Secondary | ICD-10-CM | POA: Diagnosis present

## 2017-01-08 DIAGNOSIS — Z7982 Long term (current) use of aspirin: Secondary | ICD-10-CM | POA: Diagnosis not present

## 2017-01-08 DIAGNOSIS — J439 Emphysema, unspecified: Secondary | ICD-10-CM

## 2017-01-08 DIAGNOSIS — Z87891 Personal history of nicotine dependence: Secondary | ICD-10-CM | POA: Insufficient documentation

## 2017-01-08 LAB — CBC
HEMATOCRIT: 42.9 % (ref 36.0–46.0)
HEMOGLOBIN: 14.8 g/dL (ref 12.0–15.0)
MCH: 30.3 pg (ref 26.0–34.0)
MCHC: 34.5 g/dL (ref 30.0–36.0)
MCV: 87.9 fL (ref 78.0–100.0)
PLATELETS: 276 10*3/uL (ref 150–400)
RBC: 4.88 MIL/uL (ref 3.87–5.11)
RDW: 13.5 % (ref 11.5–15.5)
WBC: 12.4 10*3/uL — AB (ref 4.0–10.5)

## 2017-01-08 LAB — BASIC METABOLIC PANEL
Anion gap: 11 (ref 5–15)
BUN: 10 mg/dL (ref 6–20)
CHLORIDE: 105 mmol/L (ref 101–111)
CO2: 22 mmol/L (ref 22–32)
CREATININE: 0.78 mg/dL (ref 0.44–1.00)
Calcium: 9.4 mg/dL (ref 8.9–10.3)
GFR calc non Af Amer: >60 mL/min (ref >60)
Glucose, Bld: 89 mg/dL (ref 65–99)
Potassium: 3.8 mmol/L (ref 3.5–5.1)
SODIUM: 138 mmol/L (ref 135–145)

## 2017-01-08 LAB — I-STAT TROPONIN, ED: Troponin i, poc: 0.01 ng/mL (ref 0.00–0.08)

## 2017-01-08 MED ORDER — IPRATROPIUM BROMIDE 0.02 % IN SOLN
0.5000 mg | Freq: Once | RESPIRATORY_TRACT | Status: AC
  Administered 2017-01-08: 0.5 mg via RESPIRATORY_TRACT
  Filled 2017-01-08: qty 2.5

## 2017-01-08 MED ORDER — ALBUTEROL SULFATE (2.5 MG/3ML) 0.083% IN NEBU
5.0000 mg | INHALATION_SOLUTION | Freq: Once | RESPIRATORY_TRACT | Status: AC
  Administered 2017-01-08: 5 mg via RESPIRATORY_TRACT
  Filled 2017-01-08: qty 6

## 2017-01-08 MED ORDER — LORAZEPAM 0.5 MG PO TABS
0.5000 mg | ORAL_TABLET | Freq: Once | ORAL | Status: AC
  Administered 2017-01-08: 0.5 mg via ORAL
  Filled 2017-01-08: qty 1

## 2017-01-08 NOTE — ED Notes (Signed)
Pt highly anxious. Reassured pt
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
## 2017-01-08 NOTE — ED Triage Notes (Signed)
Per Pt, Pt is coming from home with complaints of chest pain that started several weeks ago. Pt reports having Hx of bronchitis and this does not feel the same. Hx of collapsed lung. Pt in extreme pain and will not take a deep breath fully.

## 2017-01-09 ENCOUNTER — Emergency Department (HOSPITAL_COMMUNITY): Payer: BLUE CROSS/BLUE SHIELD

## 2017-01-09 LAB — I-STAT TROPONIN, ED: Troponin i, poc: 0 ng/mL (ref 0.00–0.08)

## 2017-01-09 MED ORDER — IOPAMIDOL (ISOVUE-370) INJECTION 76%
INTRAVENOUS | Status: AC
  Administered 2017-01-09: 100 mL
  Filled 2017-01-09: qty 100

## 2017-01-09 NOTE — Discharge Instructions (Signed)
Your chest pain and shortness of breath is likely due to emphysema, a condition of chronic obstructive pulmonary disease.  Continue with your prednisone and breathing treatment.  Follow up with your doctor for further care.  Please quit smoking.  You should call 1-800 QUITNOW for additional help to quit smoking.

## 2017-01-09 NOTE — ED Notes (Signed)
Pt back from CT

## 2017-01-09 NOTE — ED Notes (Signed)
Pt to CT

## 2017-01-14 ENCOUNTER — Institutional Professional Consult (permissible substitution): Payer: BLUE CROSS/BLUE SHIELD | Admitting: Pulmonary Disease

## 2017-01-17 ENCOUNTER — Emergency Department (HOSPITAL_COMMUNITY)
Admission: EM | Admit: 2017-01-17 | Discharge: 2017-01-17 | Disposition: A | Discharge status: Home / Self Care | Payer: BLUE CROSS/BLUE SHIELD | Attending: Emergency Medicine | Admitting: Emergency Medicine

## 2017-01-17 ENCOUNTER — Emergency Department (HOSPITAL_COMMUNITY): Payer: BLUE CROSS/BLUE SHIELD

## 2017-01-17 ENCOUNTER — Encounter (HOSPITAL_COMMUNITY): Payer: Self-pay

## 2017-01-17 ENCOUNTER — Other Ambulatory Visit: Payer: Self-pay

## 2017-01-17 DIAGNOSIS — Z87891 Personal history of nicotine dependence: Secondary | ICD-10-CM | POA: Diagnosis not present

## 2017-01-17 DIAGNOSIS — R05 Cough: Secondary | ICD-10-CM

## 2017-01-17 DIAGNOSIS — Z79899 Other long term (current) drug therapy: Secondary | ICD-10-CM | POA: Diagnosis not present

## 2017-01-17 DIAGNOSIS — R0789 Other chest pain: Secondary | ICD-10-CM | POA: Diagnosis not present

## 2017-01-17 DIAGNOSIS — R059 Cough, unspecified: Secondary | ICD-10-CM

## 2017-01-17 MED ORDER — IPRATROPIUM-ALBUTEROL 20-100 MCG/ACT IN AERS: 1.0000 | INHALATION_SPRAY | Freq: Four times a day (QID) | RESPIRATORY_TRACT | 0 refills | Status: DC

## 2017-01-17 MED ORDER — PENICILLIN G BENZATHINE 1200000 UNIT/2ML IM SUSP
2.4000 10*6.[IU] | Freq: Once | INTRAMUSCULAR | Status: DC
  Filled 2017-01-17: qty 4

## 2017-01-17 MED ORDER — HYDROCODONE-HOMATROPINE 5-1.5 MG/5ML PO SYRP: 5.0000 mL | ORAL_SOLUTION | Freq: Four times a day (QID) | ORAL | 0 refills | Status: DC | PRN

## 2017-01-17 MED ORDER — OXYCODONE-ACETAMINOPHEN 5-325 MG PO TABS: 1.0000 | ORAL_TABLET | ORAL | 0 refills | Status: AC | PRN
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
## 2017-01-17 NOTE — ED Notes (Addendum)
Ambulated one lap around pod C. SpO2 stayed at 97-100 range. Pt. Felt weak while walking

## 2017-01-17 NOTE — ED Notes (Signed)
Patient Alert and oriented X4. Stable and ambulatory. Patient verbalized understanding of the discharge instructions.  Patient belongings were taken by the patient.  

## 2017-01-17 NOTE — Discharge Instructions (Signed)
Your chest x-ray and EKG were normal today.  You have been prescribed a new inhaler, please use this as indicated.  You have been prescribed hycodan for cough and chest wall pain.  You have a prescription for a short course of narcotic pain medication, you may take this pain medication for severe chest wall pain due to coughing.  You may also take ibuprofen/tylenol for chest wall pain due to coughing. Please read attached information on cough, chest wall pain, COPD and combivent (inhaler).   Return to the emergency department if your symptoms worsen

## 2017-01-17 NOTE — ED Triage Notes (Signed)
Patient here with 2 weeks of ongoing cough. Has been seen x 2 for same and diagnosed with bronchitis and emphysema. States that it hurts her ribs to cough, no fever, NAD

## 2017-01-17 NOTE — ED Notes (Signed)
Patient is A&Ox4 at this time.  Patient in no signs of distress.  Please see providers note for complete history and physical exam.  

## 2017-02-02 ENCOUNTER — Other Ambulatory Visit: Payer: BLUE CROSS/BLUE SHIELD

## 2017-02-02 ENCOUNTER — Encounter: Payer: Self-pay | Admitting: Internal Medicine

## 2017-02-02 ENCOUNTER — Ambulatory Visit (INDEPENDENT_AMBULATORY_CARE_PROVIDER_SITE_OTHER): Payer: BLUE CROSS/BLUE SHIELD | Admitting: Internal Medicine

## 2017-02-02 VITALS — BP 112/70 | HR 89 | Ht 63.0 in | Wt 106.6 lb

## 2017-02-02 DIAGNOSIS — J449 Chronic obstructive pulmonary disease, unspecified: Secondary | ICD-10-CM

## 2017-02-02 DIAGNOSIS — F17210 Nicotine dependence, cigarettes, uncomplicated: Secondary | ICD-10-CM | POA: Diagnosis not present

## 2017-02-02 MED ORDER — FLUTTER DEVI: 0 refills | Status: DC
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
## 2017-02-02 NOTE — Assessment & Plan Note (Signed)

## 2017-02-02 NOTE — Patient Instructions (Addendum)
Please remember to go to the lab  department downstairs in the basement  for your tests - we will call you with the results when they are available.    Plan A = Automatic = Stiolto 2 pffs each am  Work on inhaler technique:  relax and gently blow all the way out then take a nice smooth deep breath back in, triggering the inhaler at same time you start breathing in.  Hold for up to 5 seconds if you can. Blow out thru nose. Rinse and gargle with water when done       Plan B = Backup Only use your combivent as a rescue medication to be used if you can't catch your breath by resting or doing a relaxed purse lip breathing pattern.  - The less you use it, the better it will work when you need it. - Ok to use the inhaler up to 1 puffs  every 4 hours if you must but call for appointment if use goes up over your usual need - Don't leave home without it !!  (think of it like the spare tire for your car)     For cough > mucinex dm up to 1200 mg every 12 hours as needed and use the flutter valve as much as possible   The key is to stop smoking completely before smoking completely stops you!   Please schedule a follow up office visit in 2 weeks, sooner if needed to see Tammy NP with all medications

## 2017-02-02 NOTE — Assessment & Plan Note (Addendum)
Alpha one testing 02/02/2017  - 02/02/2017  After extensive coaching HFA effectiveness =    90%   - 02/02/2017   Walked RA  2 laps @ 185 ft each @ nl pace stopped due to  Sob/fatigue sats 99% at end  - Spirometry 02/02/2017  FEV1 2.64 (85%)  Ratio 68  p combivent 2 h before and truncated exp loop  Symptoms are markedly disproportionate to objective findings and not clear this is a lung problem but pt does appear to have difficult airway management issues. DDX of  difficult airways management almost all start with A and  include Adherence, Ace Inhibitors, Acid Reflux, Active Sinus Disease, Alpha 1 Antitripsin deficiency, Anxiety masquerading as Airways dz,  ABPA,  Allergy(esp in young), Aspiration (esp in elderly), Adverse effects of meds,  Active smokers, A bunch of PE's (a small clot burden can't cause this syndrome unless there is already severe underlying pulm or vascular dz with poor reserve) plus two Bs  = Bronchiectasis and Beta blocker use..and one C= CHF   Adherence is always the initial "prime suspect" and is a multilayered concern that requires a "trust but verify" approach in every patient - starting with knowing how to use medications, especially inhalers, correctly, keeping up with refills and understanding the fundamental difference between maintenance and prns vs those medications only taken for a very short course and then stopped and not refilled.  - - The proper method of use, as well as anticipated side effects, of a metered-dose inhaler are discussed and demonstrated to the patient. Improved effectiveness after extensive coaching during this visit to a level of approximately 75 % from a baseline of 50 % > continue respimat, try stiolto as maint rx - needs to return with all meds in hand using a trust but verify approach to confirm accurate Medication  Reconciliation The principal here is that until we are certain that the  patients are doing what we've asked, it makes no sense to ask them to do  more.  - See calendar for specific medication instructions and bring it back for each and every office visit for every healthcare provider you see.  Without it,  you may not receive the best quality medical care that we feel you deserve.  You will note that the calendar groups together  your maintenance  medications that are timed at particular times of the day.  Think of this as your checklist for what your doctor has instructed you to do until your next evaluation to see what benefit  there is  to staying on a consistent group of medications intended to keep you well.  The other group at the bottom is entirely up to you to use as you see fit  for specific symptoms that may arise between visits that require you to treat them on an as needed basis.  Think of this as your action plan or "what if" list.   Separating the top medications from the bottom group is fundamental to providing you adequate care going forward.    Active smoking (see separate a/p)   ? Alpha one AT def > screening labs sent   ? Anxiety/depression ? narc seeking for chronic pain s/p vats > usually at the bottom of this list of usual suspects but should be much higher on this pt's based on H and P     Despite relatively mild GOLD I copd Pt is Group B in terms of symptom/risk and laba/lama therefore appropriate rx at  this point.  rx = stiolto trial best initial approach since already uses respimat fairly effectively      Total time devoted to counseling  > 50 % of initial 60 min office visit:  review case with pt/ discussion of options/alternatives/ personally creating written customized instructions  in presence of pt  then going over those specific  Instructions directly with the pt including how to use all of the meds but in particular covering each new medication in detail and the difference between the maintenance= "automatic" meds and the prns using an action plan format for the latter (If this problem/symptom => do that  organization reading Left to right).  Please see AVS from this visit for a full list of these instructions which I personally wrote for this pt and  are unique to this visit.

## 2017-02-04 LAB — ALPHA-1-ANTITRYPSIN: A-1 Antitrypsin, Ser: 197 mg/dL (ref 83–199)

## 2017-02-06 LAB — ALPHA-1 ANTITRYPSIN PHENOTYPE: A-1 Antitrypsin: 200 mg/dL — ABNORMAL HIGH (ref 83–199)

## 2017-02-09 NOTE — Progress Notes (Signed)
Spoke with pt and notified of results per Dr. Wert. Pt verbalized understanding and denied any questions. 

## 2017-02-13 ENCOUNTER — Emergency Department (HOSPITAL_COMMUNITY)
Admission: EM | Admit: 2017-02-13 | Discharge: 2017-02-13 | Disposition: A | Discharge status: Home / Self Care | Payer: BLUE CROSS/BLUE SHIELD | Attending: Emergency Medicine | Admitting: Emergency Medicine

## 2017-02-13 ENCOUNTER — Ambulatory Visit: Payer: BLUE CROSS/BLUE SHIELD | Admitting: Physician Assistant

## 2017-02-13 ENCOUNTER — Telehealth: Payer: Self-pay | Admitting: *Deleted

## 2017-02-13 ENCOUNTER — Encounter: Payer: Self-pay | Admitting: Physician Assistant

## 2017-02-13 ENCOUNTER — Encounter (HOSPITAL_COMMUNITY): Payer: Self-pay | Admitting: *Deleted

## 2017-02-13 ENCOUNTER — Ambulatory Visit (INDEPENDENT_AMBULATORY_CARE_PROVIDER_SITE_OTHER): Payer: BLUE CROSS/BLUE SHIELD | Admitting: Physician Assistant

## 2017-02-13 VITALS — BP 122/90 | HR 108 | Temp 98.0°F | Ht 61.0 in | Wt 101.0 lb

## 2017-02-13 DIAGNOSIS — F4310 Post-traumatic stress disorder, unspecified: Secondary | ICD-10-CM | POA: Diagnosis not present

## 2017-02-13 DIAGNOSIS — F17210 Nicotine dependence, cigarettes, uncomplicated: Secondary | ICD-10-CM | POA: Diagnosis not present

## 2017-02-13 DIAGNOSIS — Z23 Encounter for immunization: Secondary | ICD-10-CM

## 2017-02-13 DIAGNOSIS — R4689 Other symptoms and signs involving appearance and behavior: Secondary | ICD-10-CM | POA: Diagnosis not present

## 2017-02-13 DIAGNOSIS — F419 Anxiety disorder, unspecified: Secondary | ICD-10-CM | POA: Diagnosis not present

## 2017-02-13 DIAGNOSIS — R4589 Other symptoms and signs involving emotional state: Secondary | ICD-10-CM

## 2017-02-13 DIAGNOSIS — J449 Chronic obstructive pulmonary disease, unspecified: Secondary | ICD-10-CM | POA: Insufficient documentation

## 2017-02-13 DIAGNOSIS — Z79899 Other long term (current) drug therapy: Secondary | ICD-10-CM | POA: Insufficient documentation

## 2017-02-13 DIAGNOSIS — Z046 Encounter for general psychiatric examination, requested by authority: Secondary | ICD-10-CM | POA: Diagnosis present

## 2017-02-13 DIAGNOSIS — F339 Major depressive disorder, recurrent, unspecified: Secondary | ICD-10-CM

## 2017-02-13 DIAGNOSIS — F919 Conduct disorder, unspecified: Secondary | ICD-10-CM | POA: Insufficient documentation

## 2017-02-13 LAB — CBC WITH DIFFERENTIAL/PLATELET
BASOS ABS: 0 10*3/uL (ref 0.0–0.1)
BASOS PCT: 0 %
EOS ABS: 0.1 10*3/uL (ref 0.0–0.7)
EOS PCT: 2 %
HCT: 43.2 % (ref 36.0–46.0)
Hemoglobin: 14.9 g/dL (ref 12.0–15.0)
Lymphocytes Relative: 26 %
Lymphs Abs: 2 10*3/uL (ref 0.7–4.0)
MCH: 30.1 pg (ref 26.0–34.0)
MCHC: 34.5 g/dL (ref 30.0–36.0)
MCV: 87.3 fL (ref 78.0–100.0)
MONO ABS: 0.5 10*3/uL (ref 0.1–1.0)
Monocytes Relative: 7 %
Neutro Abs: 5.1 10*3/uL (ref 1.7–7.7)
Neutrophils Relative %: 65 %
PLATELETS: 223 10*3/uL (ref 150–400)
RBC: 4.95 MIL/uL (ref 3.87–5.11)
RDW: 12.8 % (ref 11.5–15.5)
WBC: 7.8 10*3/uL (ref 4.0–10.5)

## 2017-02-13 LAB — URINALYSIS, ROUTINE W REFLEX MICROSCOPIC
BILIRUBIN URINE: NEGATIVE
Glucose, UA: NEGATIVE mg/dL
KETONES UR: NEGATIVE mg/dL
LEUKOCYTES UA: NEGATIVE
Nitrite: NEGATIVE
Protein, ur: 30 mg/dL — AB
Specific Gravity, Urine: 1.019 (ref 1.005–1.030)
pH: 5 (ref 5.0–8.0)

## 2017-02-13 LAB — COMPREHENSIVE METABOLIC PANEL
ALT: 17 U/L (ref 14–54)
AST: 17 U/L (ref 15–41)
Albumin: 5 g/dL (ref 3.5–5.0)
Alkaline Phosphatase: 54 U/L (ref 38–126)
Anion gap: 7 (ref 5–15)
BILIRUBIN TOTAL: 0.5 mg/dL (ref 0.3–1.2)
BUN: 8 mg/dL (ref 6–20)
CALCIUM: 9.2 mg/dL (ref 8.9–10.3)
CO2: 24 mmol/L (ref 22–32)
Chloride: 106 mmol/L (ref 101–111)
Creatinine, Ser: 0.95 mg/dL (ref 0.44–1.00)
GFR calc Af Amer: >60 mL/min (ref >60)
Glucose, Bld: 99 mg/dL (ref 65–99)
POTASSIUM: 3.5 mmol/L (ref 3.5–5.1)
Sodium: 137 mmol/L (ref 135–145)
TOTAL PROTEIN: 7.9 g/dL (ref 6.5–8.1)

## 2017-02-13 LAB — RAPID URINE DRUG SCREEN, HOSP PERFORMED
Amphetamines: NOT DETECTED
BENZODIAZEPINES: POSITIVE — AB
Barbiturates: NOT DETECTED
COCAINE: NOT DETECTED
OPIATES: NOT DETECTED
Tetrahydrocannabinol: POSITIVE — AB

## 2017-02-13 LAB — ETHANOL

## 2017-02-13 MED ORDER — TEMAZEPAM 30 MG PO CAPS: 30.0000 mg | ORAL_CAPSULE | Freq: Every evening | ORAL | 0 refills | Status: DC | PRN

## 2017-02-13 MED ORDER — TRAZODONE HCL 50 MG PO TABS: 50.0000 mg | ORAL_TABLET | Freq: Two times a day (BID) | ORAL | 0 refills | Status: DC

## 2017-02-13 MED ORDER — TEMAZEPAM 15 MG PO CAPS: 30.0000 mg | ORAL_CAPSULE | Freq: Every evening | ORAL | Status: DC | PRN

## 2017-02-13 MED ORDER — TRAZODONE HCL 50 MG PO TABS: 50.0000 mg | ORAL_TABLET | Freq: Two times a day (BID) | ORAL | Status: DC

## 2017-02-13 MED ORDER — TRAZODONE HCL 50 MG PO TABS: 50.0000 mg | ORAL_TABLET | Freq: Every day | ORAL | 0 refills | Status: DC

## 2017-02-13 MED ORDER — HYDROXYZINE HCL 25 MG PO TABS: 25.0000 mg | ORAL_TABLET | Freq: Four times a day (QID) | ORAL | 0 refills | Status: DC | PRN

## 2017-02-13 NOTE — ED Notes (Signed)
Pt discharged home. Discharged instructions read to pt who verbalized understanding. All belongings returned to pt who signed for same. Denies SI/HI, is not delusional and not responding to internal stimuli. Escorted pt to the ED exit.    

## 2017-02-13 NOTE — Patient Instructions (Signed)
It was great meeting you today.  Please go to the ER for further evaluation. If you ever have any thoughts about hurting yourself, immediately go to the emergency room.

## 2017-02-13 NOTE — Telephone Encounter (Signed)
Spoke to patient about plans of care, with need to go to [facility] as she is currently suicidal and needs further evlauation. Patient's son, Eliberto Ivoryustin who is 3616 mo old was present during her visit. We discussed options of care of child, including having a family member pick up her child who would ensure safety while patient was in treatment vs child protective services. She initially called her mother in law, Vira AgarCheryl Formby, to come pick up her son but she stated that it was going to take at least 45 min to an hour for her to arrive, so the patient then called her husband, Elizbeth SquiresJosh Kumari, who was able to leave work and come to pick up Con[employer]. Because patient was willing to go to emergency department, she was agreeable to [facility] to transfer her. I did not deem her safe to transport herself to [facility].   Several witnesses saw the handoff patient's child from patient to father. There were no arguments, disagreements, or disputes. Chlid willingly went with father and father left with child. Patient was then brought back into a patient room to discuss the plans for transportation with her [facility], Lurena JoinerRebecca. Patient took all of her belongings, including a purse and jacket, with her as she left the building with the [facility] transporter.

## 2017-02-13 NOTE — Discharge Instructions (Signed)
Please start the medicine prescribed. Please see the psych doctors by calling at the numbers provided.
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
## 2017-02-13 NOTE — ED Notes (Signed)
Gave report to diane, RN in [place]

## 2017-02-13 NOTE — Progress Notes (Signed)
Pre visit review using our clinic review tool, if applicable. No additional management support is needed unless otherwise documented below in the visit note. 

## 2017-02-13 NOTE — Telephone Encounter (Signed)
[name] MottoSamantha Worley, PA, advised patient is needing psychiatric treatment due to acute onset of depression and suicidal ideation. [name] and spoke to the charge nurse, [name] Freestoneatty, to make aware of patient and plans to transport to department. [name] [name] was given information about appointment and patient's mention of being suicidal.   [name] [name] transportation was [facility]   [name] [name] driver, [name] JoinerRebecca, picked up patient from office. Patient is voluntarily en route to ED.

## 2017-02-13 NOTE — ED Notes (Signed)
ED Provider at bedside. 

## 2017-02-16 ENCOUNTER — Ambulatory Visit: Payer: BLUE CROSS/BLUE SHIELD | Admitting: Adult Health

## 2017-02-17 ENCOUNTER — Encounter: Payer: Self-pay | Admitting: *Deleted

## 2017-02-17 ENCOUNTER — Ambulatory Visit (INDEPENDENT_AMBULATORY_CARE_PROVIDER_SITE_OTHER): Payer: BLUE CROSS/BLUE SHIELD | Admitting: Adult Health

## 2017-02-17 DIAGNOSIS — J449 Chronic obstructive pulmonary disease, unspecified: Secondary | ICD-10-CM
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
## 2017-02-17 NOTE — Patient Instructions (Signed)
Continue on Stiolto 2 puffs daily , rinse after use.  Work on not smoking .  Follow up Dr. Sherene SiresWert  In 3 months and As needed

## 2017-02-17 NOTE — Assessment & Plan Note (Signed)
Improved sx control on Stiolto   Plan  Patient Instructions  Continue on Stiolto 2 puffs daily , rinse after use.  Work on not smoking .  Follow up Dr. Sherene SiresWert  In 3 months and As needed

## 2017-02-18 MED ORDER — TIOTROPIUM BROMIDE-OLODATEROL 2.5-2.5 MCG/ACT IN AERS: 2.0000 | INHALATION_SPRAY | Freq: Every day | RESPIRATORY_TRACT | 0 refills | Status: AC

## 2017-02-18 MED ORDER — TIOTROPIUM BROMIDE-OLODATEROL 2.5-2.5 MCG/ACT IN AERS: 2.0000 | INHALATION_SPRAY | Freq: Every day | RESPIRATORY_TRACT | 5 refills | Status: DC

## 2017-02-18 NOTE — Addendum Note (Signed)
Addended by: Lowell BoutonJONES, Taher Vannote E on: 02/18/2017 09:37 AM   Modules accepted: Orders

## 2017-02-19 NOTE — Progress Notes (Signed)
Chart and office note reviewed in detail s > agree with a/p as outlined   

## 2017-02-24 ENCOUNTER — Encounter (HOSPITAL_COMMUNITY): Payer: Self-pay | Admitting: Emergency Medicine

## 2017-02-24 DIAGNOSIS — R300 Dysuria: Secondary | ICD-10-CM | POA: Insufficient documentation

## 2017-02-24 DIAGNOSIS — R339 Retention of urine, unspecified: Secondary | ICD-10-CM | POA: Diagnosis present

## 2017-02-24 DIAGNOSIS — F17210 Nicotine dependence, cigarettes, uncomplicated: Secondary | ICD-10-CM | POA: Diagnosis not present

## 2017-02-24 DIAGNOSIS — Z7982 Long term (current) use of aspirin: Secondary | ICD-10-CM | POA: Diagnosis not present

## 2017-02-24 DIAGNOSIS — J449 Chronic obstructive pulmonary disease, unspecified: Secondary | ICD-10-CM | POA: Diagnosis not present

## 2017-02-24 NOTE — ED Notes (Signed)
Pt bladder scanned it read more than 300.

## 2017-02-24 NOTE — ED Triage Notes (Signed)
Pt states she had a intrauterine biopsy and US yesterday they found a cyst, this morning se follow up with PCP because she wasn't able to urinate, PCP oriented her that probably the cyst rupture and that is the reason for the pain,  They In and out her urine and sent her home, now is been more than 12 hours she is not able to urinate again and is having pain.

## 2017-02-25 ENCOUNTER — Emergency Department (HOSPITAL_COMMUNITY)
Admission: EM | Admit: 2017-02-25 | Discharge: 2017-02-25 | Disposition: A | Discharge status: Home / Self Care | Payer: BLUE CROSS/BLUE SHIELD | Attending: Emergency Medicine | Admitting: Emergency Medicine

## 2017-02-25 DIAGNOSIS — R300 Dysuria: Secondary | ICD-10-CM

## 2017-02-25 LAB — HM PAP SMEAR: HM Pap smear: POSITIVE

## 2017-02-25 MED ORDER — PHENAZOPYRIDINE HCL 100 MG PO TABS
200.0000 mg | ORAL_TABLET | Freq: Once | ORAL | Status: AC
  Administered 2017-02-25: 200 mg via ORAL
  Filled 2017-02-25: qty 2

## 2017-02-25 MED ORDER — PHENAZOPYRIDINE HCL 200 MG PO TABS: 200.0000 mg | ORAL_TABLET | Freq: Three times a day (TID) | ORAL | 0 refills | Status: DC
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
## 2017-02-25 NOTE — Discharge Instructions (Signed)
See your doctor for recheck of urinary issues. Return here if you have any recurrent retention, fever, severe pain or new concern.

## 2017-03-02 ENCOUNTER — Other Ambulatory Visit: Payer: Self-pay

## 2017-03-02 ENCOUNTER — Ambulatory Visit (INDEPENDENT_AMBULATORY_CARE_PROVIDER_SITE_OTHER): Payer: BLUE CROSS/BLUE SHIELD

## 2017-03-02 ENCOUNTER — Encounter: Payer: Self-pay | Admitting: Physician Assistant

## 2017-03-02 ENCOUNTER — Ambulatory Visit (INDEPENDENT_AMBULATORY_CARE_PROVIDER_SITE_OTHER): Payer: BLUE CROSS/BLUE SHIELD | Admitting: Sports Medicine

## 2017-03-02 ENCOUNTER — Encounter: Payer: Self-pay | Admitting: Sports Medicine

## 2017-03-02 ENCOUNTER — Ambulatory Visit (INDEPENDENT_AMBULATORY_CARE_PROVIDER_SITE_OTHER): Payer: BLUE CROSS/BLUE SHIELD | Admitting: Physician Assistant

## 2017-03-02 VITALS — BP 110/60 | HR 83 | Ht 61.0 in | Wt 107.4 lb

## 2017-03-02 VITALS — BP 110/60 | HR 83 | Temp 98.3°F | Ht 61.0 in | Wt 107.4 lb

## 2017-03-02 DIAGNOSIS — F340 Cyclothymic disorder: Secondary | ICD-10-CM

## 2017-03-02 DIAGNOSIS — M79672 Pain in left foot: Secondary | ICD-10-CM

## 2017-03-02 DIAGNOSIS — M7741 Metatarsalgia, right foot: Secondary | ICD-10-CM

## 2017-03-02 DIAGNOSIS — M7740 Metatarsalgia, unspecified foot: Secondary | ICD-10-CM | POA: Insufficient documentation

## 2017-03-02 DIAGNOSIS — M7742 Metatarsalgia, left foot: Secondary | ICD-10-CM | POA: Diagnosis not present

## 2017-03-02 MED ORDER — DICLOFENAC SODIUM 2 % TD SOLN: 1.0000 "application " | Freq: Two times a day (BID) | TRANSDERMAL | 2 refills | Status: DC

## 2017-03-02 NOTE — Patient Instructions (Signed)
It was great seeing you today!  If you are having issues sleeping, please let the [facility] know.  If you have any thoughts of hurting yourself or other people, please let someone know and go to the hospital!
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
## 2017-03-02 NOTE — Assessment & Plan Note (Signed)
Patient presents with metatarsalgia with early bunion formation she has poor fitting shoes likely significantly contributing to this and has focal pain over the first MTP.  Small metatarsal pad added to her shoes today and encouraged her to obtain new ones when she is able.   Topical Pennsaid as needed. Consider custom orthotics if any lack of improvement versus MTP injection.
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
## 2017-03-02 NOTE — Progress Notes (Signed)
Pre visit review using our clinic review tool, if applicable. No additional management support is needed unless otherwise documented below in the visit note. 

## 2017-03-16 ENCOUNTER — Ambulatory Visit: Payer: Self-pay

## 2017-03-16 ENCOUNTER — Ambulatory Visit (INDEPENDENT_AMBULATORY_CARE_PROVIDER_SITE_OTHER): Payer: BLUE CROSS/BLUE SHIELD | Admitting: Sports Medicine

## 2017-03-16 VITALS — BP 122/82 | HR 95 | Ht 61.0 in | Wt 105.8 lb

## 2017-03-16 DIAGNOSIS — M7742 Metatarsalgia, left foot: Secondary | ICD-10-CM | POA: Diagnosis not present

## 2017-03-16 MED ORDER — DICLOFENAC SODIUM 2 % TD SOLN: 1.0000 "application " | Freq: Two times a day (BID) | TRANSDERMAL | 2 refills | Status: DC

## 2017-03-16 NOTE — Assessment & Plan Note (Signed)
Ultrasound findings are concerning for focal fat necrosis versus traumatic hematoma of the plantar plate.  Her shoe wear is directly contributing to this and I emphasized the importance of obtaining new heavily cushioned shoe wear.  She should also continue with transverse metatarsal arch support and she may return for placement of met pads in her new shoes once they are obtained.  Recommend discontinuing aware of the shoes she currently has given the ridge that is right over the area of focal fat necrosis versus hematoma.  These are directly contributing to the issue.  If she obtains new shoes is having persistent ongoing symptoms would consider further diagnostic evaluation with MRI would think this is premature at this time.  Could also consider a Darco type shoe and we will look into whether her insurance would cover this or not.

## 2017-03-16 NOTE — Patient Instructions (Signed)
Please go get new shoes.  This is critical  Try soaking your foot in cool water for 10-15 minutes 3-4 times per day. Keep your foot elevated.
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
## 2017-03-21 ENCOUNTER — Emergency Department (HOSPITAL_COMMUNITY): Payer: BLUE CROSS/BLUE SHIELD

## 2017-03-21 ENCOUNTER — Encounter (HOSPITAL_COMMUNITY): Payer: Self-pay

## 2017-03-21 ENCOUNTER — Emergency Department (HOSPITAL_COMMUNITY)
Admission: EM | Admit: 2017-03-21 | Discharge: 2017-03-22 | Disposition: A | Discharge status: Home / Self Care | Payer: BLUE CROSS/BLUE SHIELD | Attending: Emergency Medicine | Admitting: Emergency Medicine

## 2017-03-21 DIAGNOSIS — Z79899 Other long term (current) drug therapy: Secondary | ICD-10-CM | POA: Diagnosis not present

## 2017-03-21 DIAGNOSIS — Y9301 Activity, walking, marching and hiking: Secondary | ICD-10-CM | POA: Diagnosis not present

## 2017-03-21 DIAGNOSIS — F17210 Nicotine dependence, cigarettes, uncomplicated: Secondary | ICD-10-CM | POA: Insufficient documentation

## 2017-03-21 DIAGNOSIS — W101XXA Fall (on)(from) sidewalk curb, initial encounter: Secondary | ICD-10-CM | POA: Insufficient documentation

## 2017-03-21 DIAGNOSIS — J449 Chronic obstructive pulmonary disease, unspecified: Secondary | ICD-10-CM | POA: Insufficient documentation

## 2017-03-21 DIAGNOSIS — S3992XA Unspecified injury of lower back, initial encounter: Secondary | ICD-10-CM | POA: Diagnosis present

## 2017-03-21 DIAGNOSIS — Y999 Unspecified external cause status: Secondary | ICD-10-CM | POA: Diagnosis not present

## 2017-03-21 DIAGNOSIS — Y929 Unspecified place or not applicable: Secondary | ICD-10-CM | POA: Insufficient documentation

## 2017-03-21 DIAGNOSIS — S300XXA Contusion of lower back and pelvis, initial encounter: Secondary | ICD-10-CM | POA: Insufficient documentation

## 2017-03-21 MED ORDER — IBUPROFEN 600 MG PO TABS: 600.0000 mg | ORAL_TABLET | Freq: Four times a day (QID) | ORAL | 0 refills | Status: DC | PRN

## 2017-03-21 MED ORDER — IBUPROFEN 800 MG PO TABS
800.0000 mg | ORAL_TABLET | Freq: Once | ORAL | Status: AC
  Administered 2017-03-21: 800 mg via ORAL
  Filled 2017-03-21: qty 1

## 2017-03-21 MED ORDER — OXYCODONE-ACETAMINOPHEN 5-325 MG PO TABS
1.0000 | ORAL_TABLET | Freq: Once | ORAL | Status: AC
  Administered 2017-03-21: 1 via ORAL
  Filled 2017-03-21: qty 1

## 2017-03-21 MED ORDER — CYCLOBENZAPRINE HCL 5 MG PO TABS: 5.0000 mg | ORAL_TABLET | Freq: Two times a day (BID) | ORAL | 0 refills | Status: DC | PRN

## 2017-03-21 NOTE — Discharge Instructions (Signed)
You likely have a bruise in your tail bone.  Please use a donut pillow to sit on as it will help alleviate the pain.  Take ibuprofen and flexeril with food for pain. Alternate between heat or ice pack for comfort.  Follow up with orthopedist as needed.

## 2017-03-21 NOTE — ED Triage Notes (Signed)
Pt states walking, slipped and fell backwards. Pt states landed on tailbone. Pt states seen by [facility] today, received xrays. Told to follow up in ED for CT scan. Pt denies any head or neck injury/trauma. Pt states pain in tailbone radiates through back. Pt denies any incontinence.
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
## 2017-03-21 NOTE — ED Notes (Signed)
Patient transported to CT 

## 2017-03-24 ENCOUNTER — Telehealth: Payer: Self-pay | Admitting: Physician Assistant

## 2017-03-24 NOTE — Telephone Encounter (Signed)
Disregard

## 2017-03-25 NOTE — Progress Notes (Signed)
[name] [name] is a 30 y.o. female is here to follow up on visit from Urgent care and ED 3/24.    History of Present Illness:   Chief Complaint  Patient presents with  . Follow-up    Urgent Care and ED visit 3/24  . Fall    3/24, hurt tailbone    HPI   Patient went to the urgent care on 03/21/2017 after recent fall, she hit her buttock against a concrete curb. At the urgent care she had a cervical spine x-ray and an L-spine x-ray and was told that she may have a nondisplaced sacral fracture. She was told that she might need a CT scan for further evaluation she was then sent to [name]. At the emergency department patient underwent a CT scan of her lumbar spine which showed no acute finding. The provider did note that the sacrum and coccyx were not included in this imaging, and and thought that he would know a dedicated CT of sacrum and coccyx could not be ordered, and the treatment would be the same regardless of outcome. Patient was told to use a donut pillow, and was given a prescription for 5 mg of Flexeril and 600 mg of ibuprofen. She is almost out of both Flexeril and ibuprofen. Her pain was a 10 at that time, and is down to about 7. She continues to ice the area and sits on pillows whenever she is on a hard surface. She denies fevers, chills, issues with bowel or bladder incontinence, radiation of pain.   Health Maintenance Due  Topic Date Due  . PAP SMEAR  06/01/2007    PMHx, SurgHx, SocialHx, FamHx, Medications, and Allergies were reviewed in the Visit Navigator and updated as appropriate.   Patient Active Problem List   Diagnosis Date Noted  . Metatarsalgia 03/02/2017  . COPD I still smoking  02/02/2017  . Cigarette smoker 02/02/2017  . Cholelithiasis and acute cholecystitis without obstruction 12/18/2015  . Pregnancy 10/19/2015  . SVD (spontaneous vaginal delivery) 10/19/2015  . Pneumothorax 05/15/2015  . Spontaneous pneumothorax 05/10/2015      Social History  Substance Use Topics  . Smoking status: Current Every Day Smoker    Packs/day: 0.50    Years: 22.00    Types: Cigarettes  . Smokeless tobacco: Never Used     Comment: Feb 2016  . Alcohol use No    Current Medications and Allergies:    Current Outpatient Prescriptions:  .  ALPRAZolam (XANAX) 0.5 MG tablet, Take 0.5 mg by mouth 3 (three) times daily as needed for anxiety. , Disp: , Rfl: 0 .  cyclobenzaprine (FLEXERIL) 5 MG tablet, Take 1 tablet (5 mg total) by mouth 2 (two) times daily as needed for muscle spasms., Disp: 20 tablet, Rfl: 0 .  Diclofenac Sodium (PENNSAID) 2 % SOLN, Place 1 application onto the skin 2 (two) times daily. Pt is interested in filling at this time, Disp: 112 g, Rfl: 2 .  Ipratropium-Albuterol (COMBIVENT) 20-100 MCG/ACT AERS respimat, Inhale 1 puff into the lungs every 6 (six) hours. (Patient taking differently: Inhale 1 puff into the lungs every 6 (six) hours as needed for wheezing or shortness of breath. ), Disp: 4 g, Rfl: 0 .  lamoTRIgine (LAMICTAL) 25 MG tablet, Take 50 mg by mouth daily. , Disp: , Rfl: 0 .  Respiratory Therapy Supplies (FLUTTER) DEVI, Use as directed, Disp: 1 each, Rfl: 0 .  Tiotropium Bromide-Olodaterol (STIOLTO RESPIMAT) 2.5-2.5 MCG/ACT AERS, Inhale 2 puffs  into the lungs daily., Disp: 4 g, Rfl: 5 .  meloxicam (MOBIC) 7.5 MG tablet, Take 1 tablet (7.5 mg total) by mouth daily., Disp: 30 tablet, Rfl: 0 .  omeprazole (PRILOSEC) 20 MG capsule, Take 1 capsule (20 mg total) by mouth daily., Disp: 30 capsule, Rfl: 0   Allergies  Allergen Reactions  . Ketorolac Other (See Comments)    Tablets cause stomach cramps (injections ok)    Review of Systems   Review of Systems  Constitutional: Negative for chills and fever.  Gastrointestinal: Negative for constipation.  Genitourinary: Negative for dysuria and urgency.  Musculoskeletal: Positive for falls. Negative for myalgias.       Fell on Saturday hurt Tailbone     Vitals:   Vitals:   03/26/17 1459  BP: 100/60  Pulse: 100  Temp: 98.9 F (37.2 C)  TempSrc: Oral  SpO2: 97%  Weight: 105 lb (47.6 kg)  Height: 5\' 1"  (1.549 m)     Body mass index is 19.84 kg/m.   Physical Exam:    Physical Exam  Constitutional: She appears well-developed. She is cooperative.  Non-toxic appearance. She does not have a sickly appearance. She does not appear ill. No distress.  Cardiovascular: Normal rate, regular rhythm, S1 normal, S2 normal, normal heart sounds and normal pulses.   No LE edema  Pulmonary/Chest: Effort normal and breath sounds normal.  Musculoskeletal:  Significant bony protrusion from the sacral area, extreme tenderness to palpation, no ecchymosis   Neurological: She is alert.  Skin: Skin is warm, dry and intact.  Psychiatric: She has a normal mood and affect. Her speech is normal and behavior is normal.  Nursing note and vitals reviewed.      Assessment and Plan:    [name] [name] was seen today for follow-up and fall.  Diagnoses and all orders for this visit:  Contusion of coccyx, subsequent encounter  Other orders -     meloxicam (MOBIC) 7.5 MG tablet; Take 1 tablet (7.5 mg total) by mouth daily. -     omeprazole (PRILOSEC) 20 MG capsule; Take 1 capsule (20 mg total) by mouth daily. -     cyclobenzaprine (FLEXERIL) 5 MG tablet; Take 1 tablet (5 mg total) by mouth 2 (two) times daily as needed for muscle spasms.  Patient evaluated with Dr. Helane Rima. Patient has significant bony coccyx deformity. Continues to have pain. I explained to her that this pain will last a while, several weeks. I will refill her Flexeril. And I'm going to switch her from ibuprofen to Mobic. I advised her to take Mobic with food. I'm also going to do some GI protection with omeprazole 20 mg daily. Patient has an appointment with Dr. Gaspar Bidding on April 2. I discussed with her that I will have Dr. Berline Chough evaluate her if he has time to address this issue on  her appointment on Monday. Her pain changes in any way or she develops fever, changes in bowel or bladder, she needs to go to the ER immediately.   . Reviewed expectations re: course of current medical issues. . Discussed self-management of symptoms. . Outlined signs and symptoms indicating need for more acute intervention. . Patient verbalized understanding and all questions were answered. . See orders for this visit as documented in the electronic medical record. . Patient received an After Visit Summary.  LPN served as Neurosurgeon during this visit. History, Physical, and Plan performed by Physician Assistant. Documentation and orders reviewed and attested to.   Jarold Motto, PA-C  [name], [place] 03/26/2017  Follow-up: No Follow-up on file.

## 2017-03-25 NOTE — Progress Notes (Signed)
Pre visit review using our clinic review tool, if applicable. No additional management support is needed unless otherwise documented below in the visit note. 

## 2017-03-26 ENCOUNTER — Encounter: Payer: Self-pay | Admitting: Physician Assistant

## 2017-03-26 ENCOUNTER — Ambulatory Visit (INDEPENDENT_AMBULATORY_CARE_PROVIDER_SITE_OTHER): Payer: BLUE CROSS/BLUE SHIELD | Admitting: Physician Assistant

## 2017-03-26 VITALS — BP 100/60 | HR 100 | Temp 98.9°F | Ht 61.0 in | Wt 105.0 lb

## 2017-03-26 DIAGNOSIS — S300XXD Contusion of lower back and pelvis, subsequent encounter: Secondary | ICD-10-CM

## 2017-03-26 MED ORDER — IBUPROFEN 600 MG PO TABS: 600.0000 mg | ORAL_TABLET | Freq: Four times a day (QID) | ORAL | 0 refills | Status: DC | PRN

## 2017-03-26 MED ORDER — CYCLOBENZAPRINE HCL 5 MG PO TABS: 5.0000 mg | ORAL_TABLET | Freq: Two times a day (BID) | ORAL | 0 refills | Status: DC | PRN

## 2017-03-26 MED ORDER — OMEPRAZOLE 20 MG PO CPDR: 20.0000 mg | DELAYED_RELEASE_CAPSULE | Freq: Every day | ORAL | 0 refills | Status: DC

## 2017-03-26 MED ORDER — MELOXICAM 7.5 MG PO TABS: 7.5000 mg | ORAL_TABLET | Freq: Every day | ORAL | 0 refills | Status: DC

## 2017-03-26 NOTE — Patient Instructions (Addendum)
It was great meeting you.  Instead of taking ibuprofen, start taking Mobic one time daily. You may continue Flexeril.  We will have Dr. Berline Choughigby address review this on Monday, if he has time available for this.   Tailbone Injury The tailbone (coccyx) is the small bone at the lower end of the spine. A tailbone injury may involve stretched ligaments, bruising, or a broken bone (fracture). Tailbone injuries can be painful, and some may take a long time to heal. What are the causes? This condition is often caused by falling and landing on the tailbone. Other causes include:  Repeated strain or friction from actions such as rowing and bicycling.  Childbirth. In some cases, the cause may not be known. What increases the risk? This condition is more common in women than in men. What are the signs or symptoms? Symptoms of this condition include:  Pain in the lower back, especially when sitting.  Pain or difficulty when standing up from a sitting position.  Bruising in the tailbone area.  Painful bowel movements.  In women, pain during intercourse. How is this diagnosed? This condition may be diagnosed based on your symptoms and a physical exam. X-rays may be taken if a fracture is suspected. You may also have other tests, such as a CT scan or MRI. How is this treated? This condition may be treated with medicines to help relieve your pain. Most tailbone injuries heal on their own in 4-6 weeks. However, recovery time may be longer if the injury involves a fracture. Follow these instructions at home:  Take medicines only as directed by your health care provider.  If directed, apply ice to the injured area:  Put ice in a plastic bag.  Place a towel between your skin and the bag.  Leave the ice on for 20 minutes, 2-3 times per day for the first 1-2 days.  Sit on a large, rubber or inflated ring or cushion to ease your pain. Lean forward when you are sitting to help decrease  discomfort.  Avoid sitting for long periods of time.  Increase your activity as the pain allows. Perform any exercises that are recommended by your health care provider or physical therapist.  If you have pain during bowel movements, use stool softeners as directed by your health care provider.  Eat a diet that includes plenty of fiber to help prevent constipation.  Keep all follow-up visits as directed by your health care provider. This is important. How is this prevented? Wear appropriate padding and sports gear when bicycling and rowing. This can help to prevent developing an injury that is caused by repeated strain or friction. Contact a health care provider if:  Your pain becomes worse.  Your bowel movements cause a great deal of discomfort.  You are unable to have a bowel movement.  You have uncontrolled urine loss (urinary incontinence).  You have a fever. This information is not intended to replace advice given to you by your health care provider. Make sure you discuss any questions you have with your health care provider. Document Released: 12/12/2000 Document Revised: 08/14/2016 Document Reviewed: 12/11/2014 Elsevier Interactive Patient Education  2017 [employer]

## 2017-03-30 ENCOUNTER — Encounter: Payer: Self-pay | Admitting: Sports Medicine

## 2017-03-30 ENCOUNTER — Ambulatory Visit (INDEPENDENT_AMBULATORY_CARE_PROVIDER_SITE_OTHER): Payer: BLUE CROSS/BLUE SHIELD | Admitting: Sports Medicine

## 2017-03-30 ENCOUNTER — Ambulatory Visit (INDEPENDENT_AMBULATORY_CARE_PROVIDER_SITE_OTHER): Payer: BLUE CROSS/BLUE SHIELD

## 2017-03-30 VITALS — BP 110/64 | HR 99 | Ht 61.0 in | Wt 106.6 lb

## 2017-03-30 DIAGNOSIS — S3210XA Unspecified fracture of sacrum, initial encounter for closed fracture: Secondary | ICD-10-CM | POA: Insufficient documentation

## 2017-03-30 DIAGNOSIS — F419 Anxiety disorder, unspecified: Secondary | ICD-10-CM | POA: Diagnosis not present

## 2017-03-30 DIAGNOSIS — M25551 Pain in right hip: Secondary | ICD-10-CM

## 2017-03-30 DIAGNOSIS — M7742 Metatarsalgia, left foot: Secondary | ICD-10-CM | POA: Diagnosis not present

## 2017-03-30 DIAGNOSIS — S300XXD Contusion of lower back and pelvis, subsequent encounter: Secondary | ICD-10-CM

## 2017-03-30 LAB — POCT URINE PREGNANCY: PREG TEST UR: NEGATIVE

## 2017-03-30 MED ORDER — DIAZEPAM 5 MG PO TABS: 5.0000 mg | ORAL_TABLET | Freq: Two times a day (BID) | ORAL | 0 refills | Status: DC | PRN

## 2017-03-30 NOTE — Assessment & Plan Note (Signed)
Some improvement in the metatarsal pain she was having.  The small area on the great toe does have underlying skin changes although this is improved.  She has persistent painful but once again less swelling.  Improved passive range of motion as well.  Next  Continue with pen said, cool water soaks and proper shoe wear
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
## 2017-03-30 NOTE — Assessment & Plan Note (Addendum)
Does appear that she has a sacral fracture that is nondisplaced.  Given the pain and stability of neurologic functions no further evaluation indicated at this time given the duration out from this injury.  Recommend avoiding any type of recurrent trauma however at this time no further intervention.  If any worsening would consider isolated/dedicated CT scan of the sacrum.  Given the pain she is in she should continue with anti-inflammatories as well as a prescription for Valium that was provided today for muscle relaxant properties.  She has responded well to this in the past.  Did recommend avoiding use of Xanax concomitantly.  She should discuss this with her psychiatric provider as well.  >50% of this 40 minute visit spent in direct patient counseling and/or coordination of care.  Discussion was focused on education regarding the in discussing the pathoetiology and anticipated clinical course of the above condition.
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
## 2017-04-08 NOTE — Progress Notes (Signed)
Pre visit review using our clinic review tool, if applicable. No additional management support is needed unless otherwise documented below in the visit note. 

## 2017-04-09 ENCOUNTER — Ambulatory Visit (INDEPENDENT_AMBULATORY_CARE_PROVIDER_SITE_OTHER): Payer: BLUE CROSS/BLUE SHIELD | Admitting: Physician Assistant

## 2017-04-09 ENCOUNTER — Encounter: Payer: Self-pay | Admitting: Physician Assistant

## 2017-04-09 VITALS — BP 120/60 | HR 103 | Temp 98.6°F | Ht 61.0 in | Wt 109.0 lb

## 2017-04-09 DIAGNOSIS — J449 Chronic obstructive pulmonary disease, unspecified: Secondary | ICD-10-CM

## 2017-04-09 DIAGNOSIS — S3210XA Unspecified fracture of sacrum, initial encounter for closed fracture: Secondary | ICD-10-CM

## 2017-04-09 DIAGNOSIS — Z0001 Encounter for general adult medical examination with abnormal findings: Secondary | ICD-10-CM | POA: Diagnosis not present

## 2017-04-09 DIAGNOSIS — M7742 Metatarsalgia, left foot: Secondary | ICD-10-CM

## 2017-04-09 DIAGNOSIS — R590 Localized enlarged lymph nodes: Secondary | ICD-10-CM | POA: Diagnosis not present

## 2017-04-09 DIAGNOSIS — R42 Dizziness and giddiness: Secondary | ICD-10-CM

## 2017-04-09 DIAGNOSIS — F17210 Nicotine dependence, cigarettes, uncomplicated: Secondary | ICD-10-CM | POA: Diagnosis not present

## 2017-04-09 DIAGNOSIS — F419 Anxiety disorder, unspecified: Secondary | ICD-10-CM | POA: Diagnosis not present

## 2017-04-09 DIAGNOSIS — G43909 Migraine, unspecified, not intractable, without status migrainosus: Secondary | ICD-10-CM

## 2017-04-09 DIAGNOSIS — R002 Palpitations: Secondary | ICD-10-CM | POA: Diagnosis not present

## 2017-04-09 DIAGNOSIS — F4310 Post-traumatic stress disorder, unspecified: Secondary | ICD-10-CM | POA: Insufficient documentation

## 2017-04-09 DIAGNOSIS — F340 Cyclothymic disorder: Secondary | ICD-10-CM

## 2017-04-09 MED ORDER — RIZATRIPTAN BENZOATE 5 MG PO TABS: 5.0000 mg | ORAL_TABLET | ORAL | 0 refills | Status: DC | PRN

## 2017-04-09 MED ORDER — CYCLOBENZAPRINE HCL 5 MG PO TABS: 5.0000 mg | ORAL_TABLET | Freq: Two times a day (BID) | ORAL | 0 refills | Status: DC | PRN

## 2017-04-09 NOTE — Assessment & Plan Note (Addendum)
Managed by Roney Mans in [facility]. Currently on 100 mg Lamictal and Xanax prn. Also sees a therapist. Has an appointment today with psych.

## 2017-04-09 NOTE — Assessment & Plan Note (Signed)
Management per Dr. Gaspar Bidding. Continue his recommendations and treatment modalities.

## 2017-04-09 NOTE — Assessment & Plan Note (Signed)
She is not ready to quit, despite discussion.

## 2017-04-09 NOTE — Assessment & Plan Note (Signed)
Continues to get migraines. Does well with Maxalt. Will order for patient.

## 2017-04-09 NOTE — Assessment & Plan Note (Signed)
Followed by Dr. Sherene Sires. Continues on Combivent and Stiolto as prescribed.

## 2017-04-09 NOTE — Patient Instructions (Signed)
Please make an appointment with the lab on your way out. I would like for you to return for lab work within 1 week. After midnight on the day of the lab draw, please do not eat anything. You may have water, black coffee, unsweetened tea.  You will be contacted about your ultrasound.  Follow-up with [place] in 3 months.  Health Maintenance, Female Adopting a healthy lifestyle and getting preventive care can go a long way to promote health and wellness. Talk with your health care provider about what schedule of regular examinations is right for you. This is a good chance for you to check in with your provider about disease prevention and staying healthy. In between checkups, there are plenty of things you can do on your own. Experts have done a lot of research about which lifestyle changes and preventive measures are most likely to keep you healthy. Ask your health care provider for more information. Weight and diet Eat a healthy diet  Be sure to include plenty of vegetables, fruits, low-fat dairy products, and lean protein.  Do not eat a lot of foods high in solid fats, added sugars, or salt.  Get regular exercise. This is one of the most important things you can do for your health.  Most adults should exercise for at least 150 minutes each week. The exercise should increase your heart rate and make you sweat (moderate-intensity exercise).  Most adults should also do strengthening exercises at least twice a week. This is in addition to the moderate-intensity exercise. Maintain a healthy weight  Body mass index (BMI) is a measurement that can be used to identify possible weight problems. It estimates body fat based on height and weight. Your health care provider can help determine your BMI and help you achieve or maintain a healthy weight.  For females 77 years of age and older:  A BMI below 18.5 is considered underweight.  A BMI of 18.5 to 24.9 is normal.  A BMI of 25 to 29.9 is considered  overweight.  A BMI of 30 and above is considered obese. Watch levels of cholesterol and blood lipids  You should start having your blood tested for lipids and cholesterol at 31 years of age, then have this test every 5 years.  You may need to have your cholesterol levels checked more often if:  Your lipid or cholesterol levels are high.  You are older than 31 years of age.  You are at high risk for heart disease. Cancer screening Lung Cancer  Lung cancer screening is recommended for adults 53-50 years old who are at high risk for lung cancer because of a history of smoking.  A yearly low-dose CT scan of the lungs is recommended for people who:  Currently smoke.  Have quit within the past 15 years.  Have at least a 30-pack-year history of smoking. A pack year is smoking an average of one pack of cigarettes a day for 1 year.  Yearly screening should continue until it has been 15 years since you quit.  Yearly screening should stop if you develop a health problem that would prevent you from having lung cancer treatment. Breast Cancer  Practice breast self-awareness. This means understanding how your breasts normally appear and feel.  It also means doing regular breast self-exams. Let your health care provider know about any changes, no matter how small.  If you are in your 20s or 30s, you should have a clinical breast exam (CBE) by a health care  provider every 1-3 years as part of a regular health exam.  If you are 73 or older, have a CBE every year. Also consider having a breast X-ray (mammogram) every year.  If you have a family history of breast cancer, talk to your health care provider about genetic screening.  If you are at high risk for breast cancer, talk to your health care provider about having an MRI and a mammogram every year.  Breast cancer gene (BRCA) assessment is recommended for women who have family members with BRCA-related cancers. BRCA-related cancers  include:  Breast.  Ovarian.  Tubal.  Peritoneal cancers.  Results of the assessment will determine the need for genetic counseling and BRCA1 and BRCA2 testing. Cervical Cancer  Your health care provider may recommend that you be screened regularly for cancer of the pelvic organs (ovaries, uterus, and vagina). This screening involves a pelvic examination, including checking for microscopic changes to the surface of your cervix (Pap test). You may be encouraged to have this screening done every 3 years, beginning at age 68.  For women ages 70-65, health care providers may recommend pelvic exams and Pap testing every 3 years, or they may recommend the Pap and pelvic exam, combined with testing for human papilloma virus (HPV), every 5 years. Some types of HPV increase your risk of cervical cancer. Testing for HPV may also be done on women of any age with unclear Pap test results.  Other health care providers may not recommend any screening for nonpregnant women who are considered low risk for pelvic cancer and who do not have symptoms. Ask your health care provider if a screening pelvic exam is right for you.  If you have had past treatment for cervical cancer or a condition that could lead to cancer, you need Pap tests and screening for cancer for at least 20 years after your treatment. If Pap tests have been discontinued, your risk factors (such as having a new sexual partner) need to be reassessed to determine if screening should resume. Some women have medical problems that increase the chance of getting cervical cancer. In these cases, your health care provider may recommend more frequent screening and Pap tests. Colorectal Cancer  This type of cancer can be detected and often prevented.  Routine colorectal cancer screening usually begins at 31 years of age and continues through 31 years of age.  Your health care provider may recommend screening at an earlier age if you have risk factors  for colon cancer.  Your health care provider may also recommend using home test kits to check for hidden blood in the stool.  A small camera at the end of a tube can be used to examine your colon directly (sigmoidoscopy or colonoscopy). This is done to check for the earliest forms of colorectal cancer.  Routine screening usually begins at age 8.  Direct examination of the colon should be repeated every 5-10 years through 31 years of age. However, you may need to be screened more often if early forms of precancerous polyps or small growths are found. Skin Cancer  Check your skin from head to toe regularly.  Tell your health care provider about any new moles or changes in moles, especially if there is a change in a mole's shape or color.  Also tell your health care provider if you have a mole that is larger than the size of a pencil eraser.  Always use sunscreen. Apply sunscreen liberally and repeatedly throughout the day.  Protect  yourself by wearing long sleeves, pants, a wide-brimmed hat, and sunglasses whenever you are outside. Heart disease, diabetes, and high blood pressure  High blood pressure causes heart disease and increases the risk of stroke. High blood pressure is more likely to develop in:  People who have blood pressure in the high end of the normal range (130-139/85-89 mm Hg).  People who are overweight or obese.  People who are African American.  If you are 2-78 years of age, have your blood pressure checked every 3-5 years. If you are 15 years of age or older, have your blood pressure checked every year. You should have your blood pressure measured twice-once when you are at a hospital or clinic, and once when you are not at a hospital or clinic. Record the average of the two measurements. To check your blood pressure when you are not at a hospital or clinic, you can use:  An automated blood pressure machine at a pharmacy.  A home blood pressure monitor.  If you  are between 66 years and 74 years old, ask your health care provider if you should take aspirin to prevent strokes.  Have regular diabetes screenings. This involves taking a blood sample to check your fasting blood sugar level.  If you are at a normal weight and have a low risk for diabetes, have this test once every three years after 31 years of age.  If you are overweight and have a high risk for diabetes, consider being tested at a younger age or more often. Preventing infection Hepatitis B  If you have a higher risk for hepatitis B, you should be screened for this virus. You are considered at high risk for hepatitis B if:  You were born in a country where hepatitis B is common. Ask your health care provider which countries are considered high risk.  Your parents were born in a high-risk country, and you have not been immunized against hepatitis B (hepatitis B vaccine).  You have HIV or AIDS.  You use needles to inject street drugs.  You live with someone who has hepatitis B.  You have had sex with someone who has hepatitis B.  You get hemodialysis treatment.  You take certain medicines for conditions, including cancer, organ transplantation, and autoimmune conditions. Hepatitis C  Blood testing is recommended for:  Everyone born from [birth year] through 1965.  Anyone with known risk factors for hepatitis C. Sexually transmitted infections (STIs)  You should be screened for sexually transmitted infections (STIs) including gonorrhea and chlamydia if:  You are sexually active and are younger than 31 years of age.  You are older than 31 years of age and your health care provider tells you that you are at risk for this type of infection.  Your sexual activity has changed since you were last screened and you are at an increased risk for chlamydia or gonorrhea. Ask your health care provider if you are at risk.  If you do not have HIV, but are at risk, it may be recommended that you  take a prescription medicine daily to prevent HIV infection. This is called pre-exposure prophylaxis (PrEP). You are considered at risk if:  You are sexually active and do not regularly use condoms or know the HIV status of your partner(s).  You take drugs by injection.  You are sexually active with a partner who has HIV. Talk with your health care provider about whether you are at high risk of being infected with HIV. If  you choose to begin PrEP, you should first be tested for HIV. You should then be tested every 3 months for as long as you are taking PrEP. Pregnancy  If you are premenopausal and you may become pregnant, ask your health care provider about preconception counseling.  If you may become pregnant, take 400 to 800 micrograms (mcg) of folic acid every day.  If you want to prevent pregnancy, talk to your health care provider about birth control (contraception). Osteoporosis and menopause  Osteoporosis is a disease in which the bones lose minerals and strength with aging. This can result in serious bone fractures. Your risk for osteoporosis can be identified using a bone density scan.  If you are 55 years of age or older, or if you are at risk for osteoporosis and fractures, ask your health care provider if you should be screened.  Ask your health care provider whether you should take a calcium or vitamin D supplement to lower your risk for osteoporosis.  Menopause may have certain physical symptoms and risks.  Hormone replacement therapy may reduce some of these symptoms and risks. Talk to your health care provider about whether hormone replacement therapy is right for you. Follow these instructions at home:  Schedule regular health, dental, and eye exams.  Stay current with your immunizations.  Do not use any tobacco products including cigarettes, chewing tobacco, or electronic cigarettes.  If you are pregnant, do not drink alcohol.  If you are breastfeeding, limit  how much and how often you drink alcohol.  Limit alcohol intake to no more than 1 drink per day for nonpregnant women. One drink equals 12 ounces of beer, 5 ounces of wine, or 1 ounces of hard liquor.  Do not use street drugs.  Do not share needles.  Ask your health care provider for help if you need support or information about quitting drugs.  Tell your health care provider if you often feel depressed.  Tell your health care provider if you have ever been abused or do not feel safe at home. This information is not intended to replace advice given to you by your health care provider. Make sure you discuss any questions you have with your health care provider. Document Released: 06/30/2011 Document Revised: 05/22/2016 Document Reviewed: 09/18/2015 Elsevier Interactive Patient Education  2017 [employer].

## 2017-04-09 NOTE — Assessment & Plan Note (Signed)
Managed by Jennifer Smith in [place]. Currently on 100 mg Lamictal and Xanax prn. Also sees a therapist. Has an appointment today with psych. 

## 2017-04-09 NOTE — Assessment & Plan Note (Signed)
Management per Dr. Gaspar Bidding in Sports Medicine. Continue Valium prn for muscle spasms. Will re-order Flexeril, so she can take when she does not want to take Valium. Advised against taking both medications.

## 2017-04-10 ENCOUNTER — Telehealth: Payer: Self-pay | Admitting: Physician Assistant

## 2017-04-10 MED ORDER — RIZATRIPTAN BENZOATE 5 MG PO TABS
5.0000 mg | ORAL_TABLET | ORAL | 0 refills | Status: DC | PRN
Start: 2017-04-10 — End: 2017-05-11

## 2017-04-10 MED ORDER — CYCLOBENZAPRINE HCL 5 MG PO TABS: 5.0000 mg | ORAL_TABLET | Freq: Two times a day (BID) | ORAL | 0 refills | Status: DC | PRN

## 2017-04-10 NOTE — Telephone Encounter (Signed)
Patient needs rx refills sent to  [facility] [address] [place] - [address] [phone] (Phone) [phone] (Fax)   cyclobenzaprine (FLEXERIL) 5 MG tablet  rizatriptan (MAXALT) 5 MG tablet  Please call and advise patient once rx orders have been placed.

## 2017-04-10 NOTE — Telephone Encounter (Signed)
Spoke to pt, told her Rx's were sent to [facility] as requested. Pt verbalized understanding.   Called other pharmacy [facility] and spoke to Jonny Ruiz told him to cancel Rx's for Flexeril and Maxalt. John said they do not fill those Rx here so they were denied. Told him okay thanks.

## 2017-04-13 ENCOUNTER — Ambulatory Visit (INDEPENDENT_AMBULATORY_CARE_PROVIDER_SITE_OTHER): Payer: BLUE CROSS/BLUE SHIELD

## 2017-04-13 ENCOUNTER — Ambulatory Visit (INDEPENDENT_AMBULATORY_CARE_PROVIDER_SITE_OTHER): Payer: BLUE CROSS/BLUE SHIELD | Admitting: Sports Medicine

## 2017-04-13 ENCOUNTER — Encounter: Payer: Self-pay | Admitting: Sports Medicine

## 2017-04-13 ENCOUNTER — Other Ambulatory Visit (INDEPENDENT_AMBULATORY_CARE_PROVIDER_SITE_OTHER): Payer: BLUE CROSS/BLUE SHIELD

## 2017-04-13 VITALS — BP 102/66 | HR 93 | Ht 61.0 in | Wt 105.8 lb

## 2017-04-13 DIAGNOSIS — F419 Anxiety disorder, unspecified: Secondary | ICD-10-CM | POA: Diagnosis not present

## 2017-04-13 DIAGNOSIS — M7742 Metatarsalgia, left foot: Secondary | ICD-10-CM | POA: Diagnosis not present

## 2017-04-13 DIAGNOSIS — R002 Palpitations: Secondary | ICD-10-CM

## 2017-04-13 DIAGNOSIS — S3210XA Unspecified fracture of sacrum, initial encounter for closed fracture: Secondary | ICD-10-CM | POA: Diagnosis not present

## 2017-04-13 DIAGNOSIS — R42 Dizziness and giddiness: Secondary | ICD-10-CM

## 2017-04-13 DIAGNOSIS — F17210 Nicotine dependence, cigarettes, uncomplicated: Secondary | ICD-10-CM

## 2017-04-13 DIAGNOSIS — Z0001 Encounter for general adult medical examination with abnormal findings: Secondary | ICD-10-CM

## 2017-04-13 LAB — LIPID PANEL
CHOL/HDL RATIO: 4
Cholesterol: 135 mg/dL (ref 0–200)
HDL: 30.8 mg/dL — AB (ref >39.00)
LDL Cholesterol: 89 mg/dL (ref 0–99)
NONHDL: 103.95
Triglycerides: 77 mg/dL (ref 0.0–149.0)
VLDL: 15.4 mg/dL (ref 0.0–40.0)

## 2017-04-13 LAB — CBC WITH DIFFERENTIAL/PLATELET
BASOS ABS: 0 10*3/uL (ref 0.0–0.1)
Basophils Relative: 0.3 % (ref 0.0–3.0)
EOS ABS: 0.1 10*3/uL (ref 0.0–0.7)
Eosinophils Relative: 1.5 % (ref 0.0–5.0)
HEMATOCRIT: 39.8 % (ref 36.0–46.0)
Hemoglobin: 13.4 g/dL (ref 12.0–15.0)
Lymphocytes Relative: 27 % (ref 12.0–46.0)
Lymphs Abs: 1.9 10*3/uL (ref 0.7–4.0)
MCHC: 33.6 g/dL (ref 30.0–36.0)
MCV: 90.2 fl (ref 78.0–100.0)
Monocytes Absolute: 0.5 10*3/uL (ref 0.1–1.0)
Monocytes Relative: 6.7 % (ref 3.0–12.0)
NEUTROS PCT: 64.5 % (ref 43.0–77.0)
Neutro Abs: 4.6 10*3/uL (ref 1.4–7.7)
Platelets: 259 10*3/uL (ref 150.0–400.0)
RBC: 4.41 Mil/uL (ref 3.87–5.11)
RDW: 13.3 % (ref 11.5–15.5)
WBC: 7.1 10*3/uL (ref 4.0–10.5)

## 2017-04-13 LAB — COMPREHENSIVE METABOLIC PANEL
ALK PHOS: 46 U/L (ref 39–117)
ALT: 12 U/L (ref 0–35)
AST: 12 U/L (ref 0–37)
Albumin: 4.5 g/dL (ref 3.5–5.2)
BILIRUBIN TOTAL: 0.4 mg/dL (ref 0.2–1.2)
BUN: 11 mg/dL (ref 6–23)
CALCIUM: 9.1 mg/dL (ref 8.4–10.5)
CO2: 28 meq/L (ref 19–32)
Chloride: 106 mEq/L (ref 96–112)
Creatinine, Ser: 0.83 mg/dL (ref 0.40–1.20)
GFR: 85.3 mL/min (ref >60.00)
GLUCOSE: 108 mg/dL — AB (ref 70–99)
Potassium: 3.2 mEq/L — ABNORMAL LOW (ref 3.5–5.1)
Sodium: 137 mEq/L (ref 135–145)
Total Protein: 6.8 g/dL (ref 6.0–8.3)

## 2017-04-13 LAB — POCT URINE PREGNANCY: PREG TEST UR: NEGATIVE

## 2017-04-13 LAB — TSH: TSH: 0.72 u[IU]/mL (ref 0.35–4.50)

## 2017-04-13 LAB — VITAMIN B12: VITAMIN B 12: 426 pg/mL (ref 211–911)

## 2017-04-13 LAB — T4, FREE: FREE T4: 0.96 ng/dL (ref 0.60–1.60)

## 2017-04-13 LAB — MAGNESIUM: MAGNESIUM: 2.1 mg/dL (ref 1.5–2.5)
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
## 2017-04-14 ENCOUNTER — Other Ambulatory Visit: Payer: Self-pay | Admitting: *Deleted

## 2017-04-14 ENCOUNTER — Ambulatory Visit
Admission: RE | Admit: 2017-04-14 | Discharge: 2017-04-14 | Disposition: A | Discharge status: Home / Self Care | Payer: BLUE CROSS/BLUE SHIELD | Source: Ambulatory Visit | Attending: Physician Assistant | Admitting: Physician Assistant

## 2017-04-14 ENCOUNTER — Other Ambulatory Visit: Payer: Self-pay | Admitting: Physician Assistant

## 2017-04-14 DIAGNOSIS — R590 Localized enlarged lymph nodes: Secondary | ICD-10-CM

## 2017-04-14 MED ORDER — POTASSIUM CHLORIDE ER 10 MEQ PO TBCR: 10.0000 meq | EXTENDED_RELEASE_TABLET | Freq: Two times a day (BID) | ORAL | 0 refills | Status: DC

## 2017-04-14 NOTE — Assessment & Plan Note (Signed)
Persistent pain with associated subcutaneous nodule.  This is trauma induced from prior poor fitting shoes.  Recommend minimizing weightbearing and crutches provided today.  Cost limitations limit the utility of postoperative shoe.

## 2017-04-14 NOTE — Assessment & Plan Note (Signed)
Nondisplaced sacral fracture overall healing well.  She continues to be mildly painful but overall no red flags.  Congenital deformity of the sacrum is noted but I do think there is underlying inferior portion fracture which is very is most focally tender on exam.

## 2017-04-14 NOTE — Assessment & Plan Note (Signed)
Doing well with the Valium previously prescribed he has discussed this with her mental health provider is not taking Xanax and Valium at the same time.  Using Valium at nighttime mainly for muscle relaxant properties.

## 2017-04-14 NOTE — Assessment & Plan Note (Signed)
Once again encouraged to discuss quitting.

## 2017-04-15 ENCOUNTER — Telehealth: Payer: Self-pay

## 2017-04-16 ENCOUNTER — Telehealth: Payer: Self-pay

## 2017-04-16 NOTE — Telephone Encounter (Signed)
This note was entered by mistake. °

## 2017-04-16 NOTE — Telephone Encounter (Signed)
On 04/10/17 ROI faxed to [facility]

## 2017-04-22 ENCOUNTER — Other Ambulatory Visit: Payer: Self-pay | Admitting: Sports Medicine

## 2017-04-22 NOTE — Telephone Encounter (Signed)
Last erfill 03/30/17 #30/0 refills. OK to refill?

## 2017-04-23 NOTE — Telephone Encounter (Signed)
Please call in refill for #30 for pt but also please call pt and inform her this is the last of this Rx from me.  Otherwise she needs to discuss with mental health provider if they are agreeable to keeping her on this

## 2017-04-24 MED ORDER — DIAZEPAM 5 MG PO TABS: 5.0000 mg | ORAL_TABLET | Freq: Two times a day (BID) | ORAL | 0 refills | Status: DC | PRN

## 2017-04-24 NOTE — Addendum Note (Signed)
Addended by: Jimmye Norman on: 04/24/2017 03:06 PM   Modules accepted: Orders

## 2017-04-24 NOTE — Telephone Encounter (Signed)
Rx called into [facility] pharmacy for Valium. Pt notified Rx called into pharmacy and Dr. Berline Chough said that this is the last refill he will do. If you need more will need to discuss with Mental Health Provider. Pt verbalized understanding.

## 2017-05-04 ENCOUNTER — Encounter: Payer: Self-pay | Admitting: Sports Medicine

## 2017-05-04 ENCOUNTER — Ambulatory Visit (INDEPENDENT_AMBULATORY_CARE_PROVIDER_SITE_OTHER): Payer: BLUE CROSS/BLUE SHIELD | Admitting: Sports Medicine

## 2017-05-04 VITALS — BP 120/80 | HR 88 | Ht 61.0 in | Wt 106.0 lb

## 2017-05-04 DIAGNOSIS — F17210 Nicotine dependence, cigarettes, uncomplicated: Secondary | ICD-10-CM

## 2017-05-04 DIAGNOSIS — M7742 Metatarsalgia, left foot: Secondary | ICD-10-CM

## 2017-05-04 DIAGNOSIS — S3210XA Unspecified fracture of sacrum, initial encounter for closed fracture: Secondary | ICD-10-CM | POA: Diagnosis not present
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
## 2017-05-05 NOTE — Assessment & Plan Note (Signed)
This does appear to be clinically improving.  She is only mildly tender to direct palpation.  Anticipate this will continue to improve.  No further evaluation at this time as she is showing steady clinical daily progress

## 2017-05-05 NOTE — Assessment & Plan Note (Signed)
Persistent pain although this does seem to be moderately improved.  The area of induration that is at the plantar aspect of her first MTP that is slightly smaller and less exquisitely painful although still is quite sore.  This does appear to be smaller and seems to be clinically improving.  We will have her continue with avoidance of irritating it with her prior poor fitting shoes.  Continue with the cushioned shoes that she has no with transverse arch support.

## 2017-05-05 NOTE — Assessment & Plan Note (Signed)
Emphasized the importance of quitting.  Also emphasized importance of healing factors with nutrition sleep and tobacco abuse.  Patient voices understanding.  She is currently pre-contemplative

## 2017-05-08 ENCOUNTER — Telehealth: Payer: Self-pay | Admitting: Internal Medicine

## 2017-05-08 MED ORDER — IPRATROPIUM-ALBUTEROL 20-100 MCG/ACT IN AERS: 1.0000 | INHALATION_SPRAY | Freq: Four times a day (QID) | RESPIRATORY_TRACT | 6 refills | Status: DC

## 2017-05-08 NOTE — Telephone Encounter (Signed)
Called and spoke with pt and she is aware of refill that has been sent to the pharmacy.  

## 2017-05-11 ENCOUNTER — Other Ambulatory Visit: Payer: Self-pay | Admitting: Physician Assistant

## 2017-05-11 DIAGNOSIS — R070 Pain in throat: Secondary | ICD-10-CM | POA: Insufficient documentation

## 2017-05-11 DIAGNOSIS — R590 Localized enlarged lymph nodes: Secondary | ICD-10-CM | POA: Insufficient documentation

## 2017-05-11 NOTE — Telephone Encounter (Signed)
Please advise if okay to refill Maxalt, last Rx 4/18, #10 tablets.

## 2017-05-12 ENCOUNTER — Encounter: Payer: Self-pay | Admitting: Physician Assistant

## 2017-05-12 ENCOUNTER — Telehealth: Payer: Self-pay | Admitting: Physician Assistant

## 2017-05-12 NOTE — Telephone Encounter (Signed)
Patient called to make sure that the following medication was sent to the pharmacy provided today. rizatriptan (MAXALT) 5 MG tablet  [address] [facility] [address] [place], [place] - [address] [phone] (Phone) [phone] (Fax)   Please call patient to advise.

## 2017-05-12 NOTE — Telephone Encounter (Signed)
Left detailed message on personal voicemail Rx was sent to pharmacy yesterday. Any problems please call office.

## 2017-05-18 ENCOUNTER — Encounter: Payer: Self-pay | Admitting: Internal Medicine

## 2017-05-18 ENCOUNTER — Ambulatory Visit (INDEPENDENT_AMBULATORY_CARE_PROVIDER_SITE_OTHER): Payer: BLUE CROSS/BLUE SHIELD | Admitting: Internal Medicine

## 2017-05-18 VITALS — BP 126/74 | HR 101 | Ht 61.0 in | Wt 106.6 lb

## 2017-05-18 DIAGNOSIS — J449 Chronic obstructive pulmonary disease, unspecified: Secondary | ICD-10-CM | POA: Diagnosis not present

## 2017-05-18 DIAGNOSIS — F17210 Nicotine dependence, cigarettes, uncomplicated: Secondary | ICD-10-CM

## 2017-05-18 MED ORDER — TIOTROPIUM BROMIDE-OLODATEROL 2.5-2.5 MCG/ACT IN AERS: 2.0000 | INHALATION_SPRAY | Freq: Every day | RESPIRATORY_TRACT | 0 refills | Status: DC

## 2017-05-18 NOTE — Assessment & Plan Note (Signed)
>   3 min discussion I reviewed the Fletcher curve with the patient that basically indicates  if you quit smoking when your best day FEV1 is   well preserved (as is still actually the case here)  it is highly unlikely you will progress to severe disease and informed the patient there was  no medication on the market that has proven to alter the curve/ its downward trajectory  or the likelihood of progression of their disease(unlike other chronic medical conditions such as atheroclerosis where we do think we can change the natural hx with risk reducing meds)    Therefore stopping smoking and maintaining abstinence is the most important aspect of care, not choice of inhalers or for that matter, doctors.
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
## 2017-05-18 NOTE — Assessment & Plan Note (Addendum)
Alpha one testing 02/02/2017 >  MM  - 02/02/2017   Walked RA  2 laps @ 185 ft each @ nl pace stopped due to  Sob/fatigue sats 99% at end  - Spirometry 02/02/2017  FEV1 2.64 (85%)  Ratio 68  p combivent 2 h before and truncated exp loop  - 05/18/2017  After extensive coaching device effectiveness =    90% with SMI>>  continue respimat stiolto    She is actually moved to a Group A copd with stiolto from a B and one could argue she just needs a lama but she's done so well on the lama/laba ok to continue for now and work harder on smoking cessation (see separate a/p)   I had an extended discussion with the patient reviewing all relevant studies completed to date and  lasting 10  minutes of a 15 minute visit    Each maintenance medication was reviewed in detail including most importantly the difference between maintenance and prns and under what circumstances the prns are to be triggered using an action plan format that is not reflected in the computer generated alphabetically organized AVS.    Please see AVS for specific instructions unique to this visit that I personally wrote and verbalized to the the pt in detail and then reviewed with pt  by my nurse highlighting any  changes in therapy recommended at today's visit to their plan of care.

## 2017-05-18 NOTE — Patient Instructions (Signed)
No change in medications   The key is to stop smoking completely before smoking completely stops you!    Please schedule a follow up visit in 6 months but call sooner if needed  

## 2017-06-01 ENCOUNTER — Other Ambulatory Visit: Payer: Self-pay | Admitting: Orthopedic Surgery

## 2017-06-01 DIAGNOSIS — M542 Cervicalgia: Secondary | ICD-10-CM

## 2017-06-02 ENCOUNTER — Other Ambulatory Visit: Payer: Self-pay | Admitting: Orthopedic Surgery

## 2017-06-02 ENCOUNTER — Ambulatory Visit
Admission: RE | Admit: 2017-06-02 | Discharge: 2017-06-02 | Disposition: A | Discharge status: Home / Self Care | Payer: BLUE CROSS/BLUE SHIELD | Source: Ambulatory Visit | Attending: Orthopedic Surgery | Admitting: Orthopedic Surgery

## 2017-06-02 DIAGNOSIS — M5442 Lumbago with sciatica, left side: Secondary | ICD-10-CM

## 2017-06-02 DIAGNOSIS — M5441 Lumbago with sciatica, right side: Secondary | ICD-10-CM

## 2017-06-15 ENCOUNTER — Ambulatory Visit: Payer: BLUE CROSS/BLUE SHIELD | Admitting: Sports Medicine

## 2017-06-16 ENCOUNTER — Encounter: Payer: Self-pay | Admitting: Sports Medicine

## 2017-06-16 ENCOUNTER — Ambulatory Visit (INDEPENDENT_AMBULATORY_CARE_PROVIDER_SITE_OTHER): Payer: BLUE CROSS/BLUE SHIELD | Admitting: Sports Medicine

## 2017-06-16 VITALS — BP 120/80 | HR 94 | Ht 61.0 in | Wt 106.4 lb

## 2017-06-16 DIAGNOSIS — M4722 Other spondylosis with radiculopathy, cervical region: Secondary | ICD-10-CM | POA: Diagnosis not present

## 2017-06-16 DIAGNOSIS — M7742 Metatarsalgia, left foot: Secondary | ICD-10-CM

## 2017-06-16 DIAGNOSIS — F419 Anxiety disorder, unspecified: Secondary | ICD-10-CM | POA: Diagnosis not present

## 2017-06-16 DIAGNOSIS — G8929 Other chronic pain: Secondary | ICD-10-CM | POA: Insufficient documentation

## 2017-06-16 DIAGNOSIS — M549 Dorsalgia, unspecified: Secondary | ICD-10-CM

## 2017-06-16 NOTE — Assessment & Plan Note (Signed)
Concerns for potential myelopathy.  Plain film x-rays obtained at outside urgent care discussed with patient today that did reportedly show anteriolisthesis at multiple levels.  She does have a positive Hoffmann sign on the left and is symptomatic on the right.  Would like for her to undergo MRI of her cervical spine given the duration of symptoms of multiple years with worsening over the past 6 weeks and concerns for potential myelopathy.

## 2017-06-16 NOTE — Assessment & Plan Note (Signed)
Patient does have a nodule on the plantar aspect of her first MTP that has previously been identified on ultrasound as a small amount of subcutaneous edema however given the persistent severe symptoms and inability to walk with worsening forefoot pain MRI of the foot is indicated to ensure no abnormal etiology of the mass.  If overall reassuring consider custom cushion orthotics.  She will plan follow-up for both of these issues after the MRIs are obtained.
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
## 2017-06-16 NOTE — Patient Instructions (Signed)

## 2017-06-23 DIAGNOSIS — Z79899 Other long term (current) drug therapy: Secondary | ICD-10-CM | POA: Insufficient documentation

## 2017-06-23 DIAGNOSIS — M5412 Radiculopathy, cervical region: Secondary | ICD-10-CM | POA: Diagnosis not present

## 2017-06-23 DIAGNOSIS — M542 Cervicalgia: Secondary | ICD-10-CM | POA: Diagnosis present

## 2017-06-23 DIAGNOSIS — F17210 Nicotine dependence, cigarettes, uncomplicated: Secondary | ICD-10-CM | POA: Diagnosis not present

## 2017-06-23 DIAGNOSIS — J449 Chronic obstructive pulmonary disease, unspecified: Secondary | ICD-10-CM | POA: Diagnosis not present

## 2017-06-24 ENCOUNTER — Emergency Department (HOSPITAL_COMMUNITY)
Admission: EM | Admit: 2017-06-24 | Discharge: 2017-06-24 | Disposition: A | Discharge status: Home / Self Care | Payer: BLUE CROSS/BLUE SHIELD | Attending: Emergency Medicine | Admitting: Emergency Medicine

## 2017-06-24 ENCOUNTER — Emergency Department (HOSPITAL_COMMUNITY): Payer: BLUE CROSS/BLUE SHIELD

## 2017-06-24 ENCOUNTER — Encounter (HOSPITAL_COMMUNITY): Payer: Self-pay | Admitting: Emergency Medicine

## 2017-06-24 ENCOUNTER — Other Ambulatory Visit: Payer: Self-pay | Admitting: Physician Assistant

## 2017-06-24 DIAGNOSIS — M5412 Radiculopathy, cervical region: Secondary | ICD-10-CM

## 2017-06-24 HISTORY — DX: Bipolar disorder, unspecified: F31.9

## 2017-06-24 MED ORDER — PREDNISONE 20 MG PO TABS: 40.0000 mg | ORAL_TABLET | Freq: Every day | ORAL | 0 refills | Status: DC

## 2017-06-24 MED ORDER — DEXAMETHASONE SODIUM PHOSPHATE 10 MG/ML IJ SOLN
10.0000 mg | Freq: Once | INTRAMUSCULAR | Status: AC
  Administered 2017-06-24: 10 mg via INTRAMUSCULAR
  Filled 2017-06-24: qty 1

## 2017-06-24 MED ORDER — LIDOCAINE 5 % EX PTCH
1.0000 | MEDICATED_PATCH | CUTANEOUS | Status: DC
  Administered 2017-06-24: 1 via TRANSDERMAL
  Filled 2017-06-24: qty 1

## 2017-06-24 MED ORDER — OXYCODONE-ACETAMINOPHEN 5-325 MG PO TABS
1.0000 | ORAL_TABLET | Freq: Once | ORAL | Status: AC
  Administered 2017-06-24: 1 via ORAL
  Filled 2017-06-24: qty 1

## 2017-06-24 MED ORDER — LIDOCAINE 5 % EX PTCH: 1.0000 | MEDICATED_PATCH | CUTANEOUS | 0 refills | Status: DC

## 2017-06-24 NOTE — ED Triage Notes (Signed)
Patient reports chronic right lateral neck pain with stiffness/pressure for several weeks , denies injury , no fever or chills .

## 2017-06-24 NOTE — Progress Notes (Signed)
Orthopedic Tech Progress Note Patient Details:  [name] [name] 07/04/1986 [id number]  Ortho Devices Type of Ortho Device: Soft collar Ortho Device/Splint Interventions: Ordered, Application, Adjustment   Trinna PostMartinez, Bransen Fassnacht J 06/24/2017, 2:24 AM

## 2017-06-24 NOTE — ED Notes (Signed)
Patient transported to MRI 

## 2017-06-24 NOTE — ED Notes (Signed)
Patient returned to room 3 from MRI.

## 2017-06-24 NOTE — Discharge Instructions (Addendum)
Take prednisone as prescribed until finished. Try to avoid taking this medication with Aleve or ibuprofen as this may irritate your stomach. You may supplement this medication with Tylenol as needed. You have also been prescribed lidocaine patches for pain. Follow-up with a neurosurgeon as soon as you are able. Call in the morning to make an appointment. Until you are able to see a neurosurgeon, continue follow-up with Dr. Berline Choughigby. You may return to the emergency department as needed for new or concerning symptoms.
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
## 2017-06-25 ENCOUNTER — Other Ambulatory Visit: Payer: Self-pay | Admitting: Internal Medicine

## 2017-06-26 ENCOUNTER — Telehealth: Payer: Self-pay

## 2017-06-26 ENCOUNTER — Ambulatory Visit (INDEPENDENT_AMBULATORY_CARE_PROVIDER_SITE_OTHER): Payer: BLUE CROSS/BLUE SHIELD | Admitting: Sports Medicine

## 2017-06-26 ENCOUNTER — Encounter: Payer: Self-pay | Admitting: Sports Medicine

## 2017-06-26 VITALS — BP 108/80 | HR 102 | Ht 61.0 in | Wt 104.8 lb

## 2017-06-26 DIAGNOSIS — M5020 Other cervical disc displacement, unspecified cervical region: Secondary | ICD-10-CM

## 2017-06-26 MED ORDER — OXYCODONE-ACETAMINOPHEN 5-325 MG PO TABS: 1.0000 | ORAL_TABLET | ORAL | 0 refills | Status: DC | PRN

## 2017-06-26 MED ORDER — TIOTROPIUM BROMIDE-OLODATEROL 2.5-2.5 MCG/ACT IN AERS: 2.0000 | INHALATION_SPRAY | Freq: Every day | RESPIRATORY_TRACT | 11 refills | Status: DC

## 2017-06-26 MED ORDER — GABAPENTIN 300 MG PO CAPS: ORAL_CAPSULE | ORAL | 1 refills | Status: DC
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
## 2017-06-26 NOTE — Telephone Encounter (Signed)
[facility] to follow up.

## 2017-06-26 NOTE — Telephone Encounter (Signed)
PA started on covermymeds for Stiolto. Awaiting response.  [name] [name] (Key: [id number])  Rx #: [id number]  Stiolto Respimat 2.5-2.5MCG/ACT IN AERS

## 2017-06-27 ENCOUNTER — Other Ambulatory Visit: Payer: BLUE CROSS/BLUE SHIELD

## 2017-06-28 ENCOUNTER — Ambulatory Visit
Admission: RE | Admit: 2017-06-28 | Discharge: 2017-06-28 | Disposition: A | Discharge status: Home / Self Care | Payer: BLUE CROSS/BLUE SHIELD | Source: Ambulatory Visit | Attending: Sports Medicine | Admitting: Sports Medicine

## 2017-06-28 DIAGNOSIS — M7742 Metatarsalgia, left foot: Secondary | ICD-10-CM

## 2017-06-28 DIAGNOSIS — M4722 Other spondylosis with radiculopathy, cervical region: Secondary | ICD-10-CM

## 2017-06-29 ENCOUNTER — Telehealth: Payer: Self-pay | Admitting: Physician Assistant

## 2017-06-29 NOTE — Telephone Encounter (Signed)
Checked CMM for update on PA, it appears that update has been canceled.  I have spoken with [place] with [employer], who states PA was canceled due to Stiolto being a perferred medication on pt's drug formulary. Verne SpurrJakayla states it appears that [employer] filed medication under institutional pack and medication needs to [facility].  [facility] have [facility] with [facility] and made them aware of the above information. [facility] ran [employer] under correct NDC number and medication went through. Pt is aware and voiced her understanding. Nothing further needed.

## 2017-06-29 NOTE — Telephone Encounter (Signed)
Dr. Berline Choughigby, I called [facility] and was advised that Dr. Alvester MorinNewton will be out of the office this week and will return July 9th and no epidural injections will be scheduled until he returns.

## 2017-06-29 NOTE — Telephone Encounter (Signed)
Please call back RE steroid inj in neck.  Appt made for 1:45pm tomorrow, July 3rd post MRI of left foot.  Thank you,  -LL

## 2017-06-29 NOTE — Telephone Encounter (Signed)
Spoke with patient, she says that she was referred for epidural injection and Dr. Berline Choughigby wanted her to have this done prior to her appointment with Dr. Danielle DessElsner because if she was feeling better after the injection she may not need to see Dr. Danielle DessElsner. I advised that I would check on this and get back to her.

## 2017-06-30 ENCOUNTER — Ambulatory Visit (INDEPENDENT_AMBULATORY_CARE_PROVIDER_SITE_OTHER): Payer: BLUE CROSS/BLUE SHIELD | Admitting: Sports Medicine

## 2017-06-30 ENCOUNTER — Encounter: Payer: Self-pay | Admitting: Sports Medicine

## 2017-06-30 ENCOUNTER — Other Ambulatory Visit: Payer: Self-pay

## 2017-06-30 VITALS — BP 110/70 | HR 106 | Ht 61.0 in | Wt 105.8 lb

## 2017-06-30 DIAGNOSIS — M129 Arthropathy, unspecified: Secondary | ICD-10-CM

## 2017-06-30 DIAGNOSIS — M5020 Other cervical disc displacement, unspecified cervical region: Secondary | ICD-10-CM | POA: Diagnosis not present

## 2017-06-30 DIAGNOSIS — M19072 Primary osteoarthritis, left ankle and foot: Secondary | ICD-10-CM

## 2017-06-30 DIAGNOSIS — M2550 Pain in unspecified joint: Secondary | ICD-10-CM

## 2017-06-30 DIAGNOSIS — M4722 Other spondylosis with radiculopathy, cervical region: Secondary | ICD-10-CM

## 2017-06-30 LAB — VITAMIN D 25 HYDROXY (VIT D DEFICIENCY, FRACTURES): VITD: 16.31 ng/mL — AB (ref 30.00–100.00)

## 2017-06-30 LAB — CBC
HEMATOCRIT: 43.8 % (ref 36.0–46.0)
Hemoglobin: 14.8 g/dL (ref 12.0–15.0)
MCHC: 33.7 g/dL (ref 30.0–36.0)
MCV: 89.1 fl (ref 78.0–100.0)
Platelets: 262 10*3/uL (ref 150.0–400.0)
RBC: 4.92 Mil/uL (ref 3.87–5.11)
RDW: 13.2 % (ref 11.5–15.5)
WBC: 8.4 10*3/uL (ref 4.0–10.5)

## 2017-06-30 LAB — COMPREHENSIVE METABOLIC PANEL
ALBUMIN: 4.3 g/dL (ref 3.5–5.2)
ALT: 18 U/L (ref 0–35)
AST: 14 U/L (ref 0–37)
Alkaline Phosphatase: 44 U/L (ref 39–117)
BUN: 11 mg/dL (ref 6–23)
CHLORIDE: 102 meq/L (ref 96–112)
CO2: 31 meq/L (ref 19–32)
Calcium: 9.1 mg/dL (ref 8.4–10.5)
Creatinine, Ser: 0.92 mg/dL (ref 0.40–1.20)
GFR: 75.64 mL/min (ref >60.00)
Glucose, Bld: 94 mg/dL (ref 70–99)
POTASSIUM: 3.4 meq/L — AB (ref 3.5–5.1)
Sodium: 137 mEq/L (ref 135–145)
Total Bilirubin: 0.4 mg/dL (ref 0.2–1.2)
Total Protein: 6.5 g/dL (ref 6.0–8.3)

## 2017-06-30 LAB — IRON AND TIBC
%SAT: 47 % (ref 11–50)
IRON: 121 ug/dL (ref 40–190)
TIBC: 259 ug/dL (ref 250–450)
UIBC: 138 ug/dL

## 2017-06-30 LAB — CK: CK TOTAL: 41 U/L (ref 7–177)

## 2017-06-30 LAB — FERRITIN: FERRITIN: 29.9 ng/mL (ref 10.0–291.0)

## 2017-06-30 LAB — C-REACTIVE PROTEIN: CRP: 0.1 mg/dL — AB (ref 0.5–20.0)

## 2017-06-30 LAB — URIC ACID: URIC ACID, SERUM: 4 mg/dL (ref 2.4–7.0)

## 2017-06-30 LAB — SEDIMENTATION RATE: Sed Rate: 1 mm/hr (ref 0–20)

## 2017-06-30 MED ORDER — COLCHICINE 0.6 MG PO CAPS: 1.0000 | ORAL_CAPSULE | Freq: Two times a day (BID) | ORAL | 0 refills | Status: DC | PRN
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
## 2017-06-30 NOTE — Telephone Encounter (Signed)
Discussed with patient at OV.  °

## 2017-06-30 NOTE — Telephone Encounter (Signed)
Order has been faxed to  Imaging. 

## 2017-07-02 ENCOUNTER — Other Ambulatory Visit: Payer: Self-pay

## 2017-07-02 LAB — ALDOLASE: ALDOLASE: 4.4 U/L (ref <=8.1)

## 2017-07-02 LAB — CYCLIC CITRUL PEPTIDE ANTIBODY, IGG: Cyclic Citrullin Peptide Ab: <16 Units

## 2017-07-02 LAB — ANA: Anti Nuclear Antibody(ANA): NEGATIVE

## 2017-07-02 MED ORDER — COLCHICINE 0.6 MG PO TABS
0.6000 mg | ORAL_TABLET | Freq: Two times a day (BID) | ORAL | 0 refills | Status: DC | PRN
Start: 2017-07-02 — End: 2017-07-06

## 2017-07-03 LAB — RHEUMATOID FACTOR: Rhuematoid fact SerPl-aCnc: <14 IU/mL (ref <14)

## 2017-07-06 ENCOUNTER — Encounter: Payer: Self-pay | Admitting: Physician Assistant

## 2017-07-06 ENCOUNTER — Ambulatory Visit (INDEPENDENT_AMBULATORY_CARE_PROVIDER_SITE_OTHER): Payer: BLUE CROSS/BLUE SHIELD

## 2017-07-06 ENCOUNTER — Ambulatory Visit (INDEPENDENT_AMBULATORY_CARE_PROVIDER_SITE_OTHER): Payer: BLUE CROSS/BLUE SHIELD | Admitting: Physician Assistant

## 2017-07-06 VITALS — BP 118/60 | HR 91 | Temp 98.1°F | Ht 61.0 in | Wt 107.0 lb

## 2017-07-06 DIAGNOSIS — R0781 Pleurodynia: Secondary | ICD-10-CM

## 2017-07-06 DIAGNOSIS — R1900 Intra-abdominal and pelvic swelling, mass and lump, unspecified site: Secondary | ICD-10-CM

## 2017-07-06 NOTE — Patient Instructions (Signed)
You will be contacted about your referral to general surgery. If you develop severe pain or bulge will not go away -- go to the ER!

## 2017-07-06 NOTE — Addendum Note (Signed)
Addended by: Haynes BastWORLEY, Tyliek Timberman J on: 07/06/2017 03:09 PM   Modules accepted: Orders

## 2017-07-06 NOTE — Assessment & Plan Note (Addendum)
Erosive changes appreciated on MRI with no appreciable soft tissue mass or nodule.  Injection offered today but she would like to defer.  We will continue with immobilization and consideration of custom cushion orthotics with first ray posting.  Autoimmune workup as above.  We will go ahead and start her on colchicine for diagnostic and therapeutic purposes.

## 2017-07-06 NOTE — Assessment & Plan Note (Addendum)
[facility]  Follow-up in 6-8 weeks

## 2017-07-06 NOTE — Assessment & Plan Note (Signed)
Patient has multiple skin complaints and evidence of potential risk of changes on her MRI of her first MTP.  We will go ahead and obtain rheumatologic evaluation to ensure no underlying autoimmune process.  We will also check uric acid levels. Continue positive findings will need referral to rheumatology otherwise we will plan to follow-up with her 6-8 weeks after the epidural steroid injection.
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
## 2017-07-08 ENCOUNTER — Other Ambulatory Visit: Payer: Self-pay

## 2017-07-08 MED ORDER — CHOLECALCIFEROL 1.25 MG (50000 UT) PO TABS: 1.0000 | ORAL_TABLET | ORAL | 0 refills | Status: DC

## 2017-07-09 ENCOUNTER — Ambulatory Visit
Admission: RE | Admit: 2017-07-09 | Discharge: 2017-07-09 | Disposition: A | Discharge status: Home / Self Care | Payer: BLUE CROSS/BLUE SHIELD | Source: Ambulatory Visit | Attending: Sports Medicine | Admitting: Sports Medicine

## 2017-07-09 DIAGNOSIS — M4722 Other spondylosis with radiculopathy, cervical region: Secondary | ICD-10-CM

## 2017-07-09 MED ORDER — IOPAMIDOL (ISOVUE-M 300) INJECTION 61%
1.0000 mL | Freq: Once | INTRAMUSCULAR | Status: AC | PRN
  Administered 2017-07-09: 1 mL via EPIDURAL

## 2017-07-09 MED ORDER — TRIAMCINOLONE ACETONIDE 40 MG/ML IJ SUSP (RADIOLOGY)
60.0000 mg | Freq: Once | INTRAMUSCULAR | Status: AC
  Administered 2017-07-09: 60 mg via EPIDURAL

## 2017-07-09 NOTE — Discharge Instructions (Signed)

## 2017-07-22 ENCOUNTER — Telehealth: Payer: Self-pay | Admitting: Sports Medicine

## 2017-07-22 ENCOUNTER — Ambulatory Visit (INDEPENDENT_AMBULATORY_CARE_PROVIDER_SITE_OTHER): Payer: BLUE CROSS/BLUE SHIELD | Admitting: Sports Medicine

## 2017-07-22 VITALS — BP 100/80 | HR 88 | Ht 61.0 in | Wt 106.2 lb

## 2017-07-22 DIAGNOSIS — J449 Chronic obstructive pulmonary disease, unspecified: Secondary | ICD-10-CM

## 2017-07-22 DIAGNOSIS — M4722 Other spondylosis with radiculopathy, cervical region: Secondary | ICD-10-CM

## 2017-07-22 DIAGNOSIS — M5020 Other cervical disc displacement, unspecified cervical region: Secondary | ICD-10-CM

## 2017-07-22 MED ORDER — PREDNISONE 20 MG PO TABS: ORAL_TABLET | ORAL | 0 refills | Status: DC

## 2017-07-22 NOTE — Telephone Encounter (Signed)
Patient called in stating Dr. Danielle DessElsner was not able to get her in until August 29th. Patient would like to know if she can be referred elsewhere to be seen sooner. Please call patient and advise.
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
## 2017-07-23 NOTE — Telephone Encounter (Signed)
FYI. Do you want to process with the MRI or wait and have her seen next week?

## 2017-07-23 NOTE — Telephone Encounter (Signed)
Okay to refer to another surgeon in practice? Any other recommended names?

## 2017-07-23 NOTE — Telephone Encounter (Signed)
Spoke with pt and advised. She will contact Dr. Verlee RossettiElsner's office tomorrow regarding appointment information if she hasn't heard back from them.

## 2017-07-23 NOTE — Telephone Encounter (Signed)
Okay to wait until Dr. Danielle DessElsner can see her next week.  If she has any progressive weakness or continued WORSENING symptoms she should be seen in the emergency department.

## 2017-07-23 NOTE — Telephone Encounter (Signed)
Spoke with Dr. Verlee RossettiElsner's office. They are forwarding a message to the on call physician about her issues from the recent epidural injection. They will call our office back and/or the patient to confirm availability.

## 2017-07-23 NOTE — Telephone Encounter (Signed)
Per Kriste BasqueBecky, patient will be scheduled w/ Dr. Danielle DessElsner next week. She will be worked.  Please call back.  Thank you,  -LL

## 2017-07-24 NOTE — Assessment & Plan Note (Addendum)
Discussed multiple options today for her.  Ultimately she is going to see Dr. Danielle DessElsner given the findings we can try to set her up for an epidural steroid injection with Dr. Alvester MorinNewton.  Referral has been placed.  Additionally we did discuss the addition of gabapentin which she is interested in trying at this time.  Psychiatric medication interactions were discussed and she should discuss this with her mental health provider as well.  Overall the findings are somewhat reassuring from the standpoint of no significant signs of myelopathy however given the Hoffmann sign and progressive symptoms surgical and intervention may be indicated I appreciate Dr. Verlee RossettiElsner's expertise.  >50% of this 25 minute visit spent in direct patient counseling and/or coordination of care.  Discussion was focused on education regarding the in discussing the pathoetiology and anticipated clinical course of the above condition.  I did review the MRI images with the patient personally as well as discuss treatment options.  There is small prescription for Percocet provided.  Patient understands that this is a one-time prescription that I will not be continuing chronic opioid therapy for her.  If she is interested in this we can discuss referral to chronic pain management however given her underlying prior history would like to try to avoid this if possible.

## 2017-08-09 ENCOUNTER — Other Ambulatory Visit: Payer: Self-pay | Admitting: Physician Assistant

## 2017-08-14 ENCOUNTER — Ambulatory Visit (INDEPENDENT_AMBULATORY_CARE_PROVIDER_SITE_OTHER): Payer: BLUE CROSS/BLUE SHIELD

## 2017-08-14 ENCOUNTER — Encounter: Payer: Self-pay | Admitting: Physician Assistant

## 2017-08-14 ENCOUNTER — Ambulatory Visit (INDEPENDENT_AMBULATORY_CARE_PROVIDER_SITE_OTHER): Payer: BLUE CROSS/BLUE SHIELD | Admitting: Physician Assistant

## 2017-08-14 VITALS — BP 126/76 | HR 92 | Temp 98.7°F | Ht 61.0 in | Wt 105.0 lb

## 2017-08-14 DIAGNOSIS — J069 Acute upper respiratory infection, unspecified: Secondary | ICD-10-CM

## 2017-08-14 DIAGNOSIS — J449 Chronic obstructive pulmonary disease, unspecified: Secondary | ICD-10-CM

## 2017-08-14 DIAGNOSIS — H6690 Otitis media, unspecified, unspecified ear: Secondary | ICD-10-CM

## 2017-08-14 MED ORDER — AMOXICILLIN-POT CLAVULANATE 875-125 MG PO TABS: 1.0000 | ORAL_TABLET | Freq: Two times a day (BID) | ORAL | 0 refills | Status: AC

## 2017-08-14 MED ORDER — BENZONATATE 200 MG PO CAPS: 200.0000 mg | ORAL_CAPSULE | Freq: Two times a day (BID) | ORAL | 0 refills | Status: DC | PRN

## 2017-08-14 MED ORDER — METHYLPREDNISOLONE ACETATE 80 MG/ML IJ SUSP
80.0000 mg | Freq: Once | INTRAMUSCULAR | Status: AC
  Administered 2017-08-14: 80 mg via INTRAMUSCULAR

## 2017-08-14 NOTE — Patient Instructions (Addendum)
For cough use mucinex DM up to 1200 mg every 12 hours as needed and use the flutter valve as much as possible.   Continue inhalers.  Start Augmentin antibiotic. Use tessalon perles as needed for cough -- do not crush or chew. Push fluids.  If cough worsens or does not improve, please let us know. If you have severe shortness of breath, please go to the ER.   Upper Respiratory Infection, Adult Most upper respiratory infections (URIs) are a viral infection of the air passages leading to the lungs. A URI affects the nose, throat, and upper air passages. The most common type of URI is nasopharyngitis and is typically referred to as "the common cold." URIs run their course and usually go away on their own. Most of the time, a URI does not require medical attention, but sometimes a bacterial infection in the upper airways can follow a viral infection. This is called a secondary infection. Sinus and middle ear infections are common types of secondary upper respiratory infections. Bacterial pneumonia can also complicate a URI. A URI can worsen asthma and chronic obstructive pulmonary disease (COPD). Sometimes, these complications can require emergency medical care and may be life threatening. What are the causes? Almost all URIs are caused by viruses. A virus is a type of germ and can spread from one person to another. What increases the risk? You may be at risk for a URI if:  You smoke.  You have chronic heart or lung disease.  You have a weakened defense (immune) system.  You are very young or very old.  You have nasal allergies or asthma.  You work in crowded or poorly ventilated areas.  You work in health care facilities or schools.  What are the signs or symptoms? Symptoms typically develop 2-3 days after you come in contact with a cold virus. Most viral URIs last 7-10 days. However, viral URIs from the influenza virus (flu virus) can last 14-18 days and are typically more severe.  Symptoms may include:  Runny or stuffy (congested) nose.  Sneezing.  Cough.  Sore throat.  Headache.  Fatigue.  Fever.  Loss of appetite.  Pain in your forehead, behind your eyes, and over your cheekbones (sinus pain).  Muscle aches.  How is this diagnosed? Your health care provider may diagnose a URI by:  Physical exam.  Tests to check that your symptoms are not due to another condition such as: ? Strep throat. ? Sinusitis. ? Pneumonia. ? Asthma.  How is this treated? A URI goes away on its own with time. It cannot be cured with medicines, but medicines may be prescribed or recommended to relieve symptoms. Medicines may help:  Reduce your fever.  Reduce your cough.  Relieve nasal congestion.  Follow these instructions at home:  Take medicines only as directed by your health care provider.  Gargle warm saltwater or take cough drops to comfort your throat as directed by your health care provider.  Use a warm mist humidifier or inhale steam from a shower to increase air moisture. This may make it easier to breathe.  Drink enough fluid to keep your urine clear or pale yellow.  Eat soups and other clear broths and maintain good nutrition.  Rest as needed.  Return to work when your temperature has returned to normal or as your health care provider advises. You may need to stay home longer to avoid infecting others. You can also use a face mask and careful hand washing to prevent  spread of the virus.  Increase the usage of your inhaler if you have asthma.  Do not use any tobacco products, including cigarettes, chewing tobacco, or electronic cigarettes. If you need help quitting, ask your health care provider. How is this prevented? The best way to protect yourself from getting a cold is to practice good hygiene.  Avoid oral or hand contact with people with cold symptoms.  Wash your hands often if contact occurs.  There is no clear evidence that vitamin C,  vitamin E, echinacea, or exercise reduces the chance of developing a cold. However, it is always recommended to get plenty of rest, exercise, and practice good nutrition. Contact a health care provider if:  You are getting worse rather than better.  Your symptoms are not controlled by medicine.  You have chills.  You have worsening shortness of breath.  You have brown or red mucus.  You have yellow or brown nasal discharge.  You have pain in your face, especially when you bend forward.  You have a fever.  You have swollen neck glands.  You have pain while swallowing.  You have white areas in the back of your throat. Get help right away if:  You have severe or persistent: ? Headache. ? Ear pain. ? Sinus pain. ? Chest pain.  You have chronic lung disease and any of the following: ? Wheezing. ? Prolonged cough. ? Coughing up blood. ? A change in your usual mucus.  You have a stiff neck.  You have changes in your: ? Vision. ? Hearing. ? Thinking. ? Mood. This information is not intended to replace advice given to you by your health care provider. Make sure you discuss any questions you have with your health care provider. Document Released: 06/10/2001 Document Revised: 08/17/2016 Document Reviewed: 03/22/2014 Elsevier Interactive Patient Education  2017 [employer].
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
## 2017-08-21 NOTE — Assessment & Plan Note (Signed)
Patient is literally sitting on the exam table shaking and pain due to the discomfort she has.  She reports essentially no improvement after epidural and in fact reports a significant worsening in her symptoms and pain following the epidural.  She does not have any new significant neurologic deficits and strength is diminished in a nondermatomal distribution. I would like for her to follow-up with neurosurgery since she has seen them low threshold to repeat the MRI if any progressive symptoms.  Low likelihood especially given his presentation but need to consider the possibility of a postinjection hematoma but this is several weeks out and once again symptoms do not entirely correlate with this.

## 2017-08-21 NOTE — Assessment & Plan Note (Signed)
We did try to help coordinate getting her back to [facility] and they will be in contact with her after her office visit.

## 2017-08-21 NOTE — Assessment & Plan Note (Signed)
Once again encouraged to quit and also discussed the importance of smoking cessation regarding pain control

## 2017-08-29 HISTORY — PX: CERVICAL DISC ARTHROPLASTY: SHX587

## 2017-09-07 ENCOUNTER — Encounter: Payer: Self-pay | Admitting: Physician Assistant

## 2017-09-07 ENCOUNTER — Ambulatory Visit (INDEPENDENT_AMBULATORY_CARE_PROVIDER_SITE_OTHER): Payer: BLUE CROSS/BLUE SHIELD

## 2017-09-07 ENCOUNTER — Telehealth: Payer: Self-pay | Admitting: *Deleted

## 2017-09-07 ENCOUNTER — Ambulatory Visit (INDEPENDENT_AMBULATORY_CARE_PROVIDER_SITE_OTHER): Payer: BLUE CROSS/BLUE SHIELD | Admitting: Physician Assistant

## 2017-09-07 VITALS — BP 130/76 | HR 96 | Temp 98.4°F | Ht 61.0 in | Wt 101.5 lb

## 2017-09-07 DIAGNOSIS — M25551 Pain in right hip: Secondary | ICD-10-CM

## 2017-09-07 DIAGNOSIS — M25561 Pain in right knee: Secondary | ICD-10-CM

## 2017-09-07 DIAGNOSIS — M5020 Other cervical disc displacement, unspecified cervical region: Secondary | ICD-10-CM | POA: Diagnosis not present

## 2017-09-07 DIAGNOSIS — Z23 Encounter for immunization: Secondary | ICD-10-CM

## 2017-09-07 DIAGNOSIS — M545 Low back pain: Secondary | ICD-10-CM | POA: Diagnosis not present

## 2017-09-07 DIAGNOSIS — T7491XA Unspecified adult maltreatment, confirmed, initial encounter: Secondary | ICD-10-CM | POA: Diagnosis not present

## 2017-09-07 LAB — POCT URINE PREGNANCY: Preg Test, Ur: NEGATIVE

## 2017-09-07 MED ORDER — OXYCODONE-ACETAMINOPHEN 5-325 MG PO TABS: 1.0000 | ORAL_TABLET | ORAL | 0 refills | Status: DC | PRN
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
## 2017-09-07 NOTE — Patient Instructions (Addendum)
Please call us if you ever need anything.
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
## 2017-09-10 ENCOUNTER — Other Ambulatory Visit: Payer: Self-pay | Admitting: Sports Medicine

## 2017-09-10 DIAGNOSIS — M5020 Other cervical disc displacement, unspecified cervical region: Secondary | ICD-10-CM

## 2017-09-16 ENCOUNTER — Telehealth: Payer: Self-pay | Admitting: Physician Assistant

## 2017-09-16 NOTE — Telephone Encounter (Signed)
Needs a new script for oxycodone/percocet 5-325mg .  Ty,  -LL

## 2017-09-17 ENCOUNTER — Encounter: Payer: Self-pay | Admitting: Physician Assistant

## 2017-09-17 ENCOUNTER — Telehealth: Payer: Self-pay | Admitting: *Deleted

## 2017-09-17 ENCOUNTER — Ambulatory Visit (INDEPENDENT_AMBULATORY_CARE_PROVIDER_SITE_OTHER): Payer: BLUE CROSS/BLUE SHIELD | Admitting: Physician Assistant

## 2017-09-17 VITALS — BP 134/88 | HR 105 | Resp 16 | Wt 102.8 lb

## 2017-09-17 DIAGNOSIS — M5020 Other cervical disc displacement, unspecified cervical region: Secondary | ICD-10-CM | POA: Diagnosis not present
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
## 2017-09-17 NOTE — Telephone Encounter (Signed)
Please see message and advise 

## 2017-09-17 NOTE — Telephone Encounter (Signed)
Patient needs an appointment.  Jarold Motto PA-C 09/17/17

## 2017-09-17 NOTE — Telephone Encounter (Signed)
Called pharmacy and spoke to [place], regarding pt's Rx for Percocet. Told her pt came to pharmacy to have Rx filled that Dr. Danielle Dess wrote from surgery on 9/17 and refused to fill due to Rx written by [facility] on 9/10. Explained to [place] that Lelon Mast wrote enough just to get pt to surgery and pt is taking it every 4 hours and is out of medication and needs Rx filled that Dr. Danielle Dess wrote. Areta Haber said it was because the Rx said last longer than 2 weeks. Told her pt is taking every 4 hours and needs Rx filled that Dr. Danielle Dess wrote due to surgery and he is taking over her Rx's. Hui verbalized understanding and said have pt bring Rx and she will fill it. Told her thank you. Pt is here in the office and I will send her over.  Told pt I spoke to pharmacy and just need to take Rx there and they will fill it. Pt verbalized understanding.

## 2017-09-17 NOTE — Telephone Encounter (Signed)
Please call pt and schedule appointment per [facility].

## 2017-09-19 ENCOUNTER — Ambulatory Visit (HOSPITAL_COMMUNITY)
Admission: EM | Admit: 2017-09-19 | Discharge: 2017-09-19 | Disposition: A | Discharge status: Home / Self Care | Payer: BLUE CROSS/BLUE SHIELD | Attending: Family | Admitting: Family

## 2017-09-19 ENCOUNTER — Encounter (HOSPITAL_COMMUNITY): Payer: Self-pay | Admitting: Family Medicine

## 2017-09-19 DIAGNOSIS — Z5189 Encounter for other specified aftercare: Secondary | ICD-10-CM | POA: Diagnosis not present

## 2017-09-19 DIAGNOSIS — I10 Essential (primary) hypertension: Secondary | ICD-10-CM

## 2017-09-19 MED ORDER — CEPHALEXIN 500 MG PO CAPS: 500.0000 mg | ORAL_CAPSULE | Freq: Four times a day (QID) | ORAL | 0 refills | Status: DC

## 2017-09-19 NOTE — Discharge Instructions (Signed)
As discussed, I will treat empirically for infection, please stay very vigilant for any signs of erythema, increased swelling, purulent discharge. Please follow up with neurosurgeon next week and also primary for another wound check.  Ensure to take probiotics while on antibiotics and also for 2 weeks after completion. It is important to re-colonize the gut with good bacteria and also to prevent any diarrheal infections associated with antibiotic use.    If there is no improvement in your symptoms, or if there is any worsening of symptoms, or if you have any additional concerns, please return for re-evaluation; or, if we are closed, consider going to the Emergency Room for evaluation if symptoms urgent.
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
## 2017-09-19 NOTE — ED Triage Notes (Signed)
Pt here for wound check to neck. Reports surgery this past Monday and wants to ensure wound is not infected.

## 2017-09-21 ENCOUNTER — Encounter: Payer: Self-pay | Admitting: Physician Assistant

## 2017-09-21 ENCOUNTER — Ambulatory Visit (INDEPENDENT_AMBULATORY_CARE_PROVIDER_SITE_OTHER): Payer: BLUE CROSS/BLUE SHIELD | Admitting: Physician Assistant

## 2017-09-21 VITALS — BP 140/80 | HR 100 | Temp 98.5°F | Ht 61.0 in | Wt 104.5 lb

## 2017-09-21 DIAGNOSIS — Z5189 Encounter for other specified aftercare: Secondary | ICD-10-CM | POA: Diagnosis not present

## 2017-09-21 DIAGNOSIS — M5020 Other cervical disc displacement, unspecified cervical region: Secondary | ICD-10-CM
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
## 2017-09-21 NOTE — Patient Instructions (Addendum)
It was great to see you!  Continue the antibiotic. Keep area moist with vaseline. Use regular soap and water to keep area clean.  Make an appointment with Dr. Danielle Dess.  Follow-up if you develop worsening swelling, redness, discharge from site. If you develop fever, please let us know about this too. Any difficulty with swallowing water or shortness of breath or issues with breathing --> GO TO THE ER

## 2017-10-22 ENCOUNTER — Other Ambulatory Visit: Payer: Self-pay | Admitting: *Deleted

## 2017-10-22 MED ORDER — RIZATRIPTAN BENZOATE 5 MG PO TABS: ORAL_TABLET | ORAL | 1 refills | Status: DC

## 2017-11-04 ENCOUNTER — Telehealth: Payer: Self-pay | Admitting: Internal Medicine

## 2017-11-04 NOTE — Telephone Encounter (Signed)
ERROR

## 2017-11-10 ENCOUNTER — Encounter: Payer: Self-pay | Admitting: Internal Medicine

## 2017-11-10 ENCOUNTER — Ambulatory Visit (INDEPENDENT_AMBULATORY_CARE_PROVIDER_SITE_OTHER): Payer: BLUE CROSS/BLUE SHIELD | Admitting: Internal Medicine

## 2017-11-10 VITALS — BP 142/70 | HR 117 | Ht 63.0 in | Wt 110.0 lb

## 2017-11-10 DIAGNOSIS — F17210 Nicotine dependence, cigarettes, uncomplicated: Secondary | ICD-10-CM | POA: Diagnosis not present

## 2017-11-10 DIAGNOSIS — K089 Disorder of teeth and supporting structures, unspecified: Secondary | ICD-10-CM

## 2017-11-10 DIAGNOSIS — Z23 Encounter for immunization: Secondary | ICD-10-CM

## 2017-11-10 DIAGNOSIS — J449 Chronic obstructive pulmonary disease, unspecified: Secondary | ICD-10-CM

## 2017-11-10 NOTE — Assessment & Plan Note (Addendum)
Alpha one testing 02/02/2017 >  MM  - 02/02/2017   Walked RA  2 laps @ 185 ft each @ nl pace stopped due to  Sob/fatigue sats 99% at end  - Spirometry 02/02/2017  FEV1 2.64 (85%)  Ratio 68  p combivent 2 h before and truncated exp loop  - 05/18/2017  After extensive coaching device effectiveness =    90% with SMI>>  continue respimat stiolto   Overall doing ok on laba/lama with minimal need for rescue despite active smoking so hold off on adding ICS at this point and work harder on smoking cessation (see separate a/p)   Appropriate for prevnar > given    I had an extended discussion with the patient reviewing all relevant studies completed to date and  lasting 15 to 20 minutes of a 25 minute visit    Each maintenance medication was reviewed in detail including most importantly the difference between maintenance and prns and under what circumstances the prns are to be triggered using an action plan format that is not reflected in the computer generated alphabetically organized AVS.    Please see AVS for specific instructions unique to this visit that I personally wrote and verbalized to the the pt in detail and then reviewed with pt  by my nurse highlighting any  changes in therapy recommended at today's visit to their plan of care.

## 2017-11-10 NOTE — Assessment & Plan Note (Signed)
>   3 min Discussed the risks and costs (both direct and indirect)  of smoking relative to the benefits of quitting but patient unwilling to commit at this point to a specific quit date.       

## 2017-11-10 NOTE — Assessment & Plan Note (Signed)
Strongly advise oral sugery eval if insurance will cover as high risk of pulmonary infection

## 2017-11-10 NOTE — Addendum Note (Signed)
Addended by: Christen ButterASKIN, Allisa Einspahr M on: 11/10/2017 10:00 AM   Modules accepted: Orders

## 2017-11-10 NOTE — Patient Instructions (Addendum)
The most important aspect of your care is stop smoking   Check with your insurance to see if they cover oral surgery and if so I would pursue extraction    Prevnar 13 today    Please schedule a follow up visit in 6 months but call sooner if needed
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
## 2017-11-25 ENCOUNTER — Ambulatory Visit (HOSPITAL_COMMUNITY)
Admission: RE | Admit: 2017-11-25 | Discharge: 2017-11-25 | Disposition: A | Discharge status: Home / Self Care | Payer: BLUE CROSS/BLUE SHIELD | Source: Ambulatory Visit | Attending: Sports Medicine | Admitting: Sports Medicine

## 2017-11-25 ENCOUNTER — Ambulatory Visit (INDEPENDENT_AMBULATORY_CARE_PROVIDER_SITE_OTHER): Payer: BLUE CROSS/BLUE SHIELD | Admitting: Family Medicine

## 2017-11-25 ENCOUNTER — Encounter: Payer: Self-pay | Admitting: Family Medicine

## 2017-11-25 ENCOUNTER — Ambulatory Visit (INDEPENDENT_AMBULATORY_CARE_PROVIDER_SITE_OTHER): Payer: BLUE CROSS/BLUE SHIELD | Admitting: Sports Medicine

## 2017-11-25 ENCOUNTER — Encounter: Payer: Self-pay | Admitting: Sports Medicine

## 2017-11-25 ENCOUNTER — Ambulatory Visit (INDEPENDENT_AMBULATORY_CARE_PROVIDER_SITE_OTHER): Payer: BLUE CROSS/BLUE SHIELD

## 2017-11-25 VITALS — BP 112/72 | HR 108 | Ht 63.0 in | Wt 108.0 lb

## 2017-11-25 VITALS — BP 112/72 | HR 107 | Ht 63.0 in | Wt 108.8 lb

## 2017-11-25 DIAGNOSIS — M25551 Pain in right hip: Secondary | ICD-10-CM

## 2017-11-25 DIAGNOSIS — R9389 Abnormal findings on diagnostic imaging of other specified body structures: Secondary | ICD-10-CM | POA: Insufficient documentation

## 2017-11-25 DIAGNOSIS — R5383 Other fatigue: Secondary | ICD-10-CM | POA: Insufficient documentation

## 2017-11-25 DIAGNOSIS — M47816 Spondylosis without myelopathy or radiculopathy, lumbar region: Secondary | ICD-10-CM | POA: Diagnosis not present

## 2017-11-25 DIAGNOSIS — M25451 Effusion, right hip: Secondary | ICD-10-CM | POA: Diagnosis not present

## 2017-11-25 DIAGNOSIS — J449 Chronic obstructive pulmonary disease, unspecified: Secondary | ICD-10-CM

## 2017-11-25 DIAGNOSIS — M4127 Other idiopathic scoliosis, lumbosacral region: Secondary | ICD-10-CM

## 2017-11-25 DIAGNOSIS — M8580 Other specified disorders of bone density and structure, unspecified site: Secondary | ICD-10-CM | POA: Diagnosis not present

## 2017-11-25 DIAGNOSIS — M2550 Pain in unspecified joint: Secondary | ICD-10-CM | POA: Diagnosis not present

## 2017-11-25 LAB — COMPREHENSIVE METABOLIC PANEL
ALBUMIN: 4.7 g/dL (ref 3.5–5.2)
ALT: 14 U/L (ref 0–35)
AST: 15 U/L (ref 0–37)
Alkaline Phosphatase: 64 U/L (ref 39–117)
BUN: 6 mg/dL (ref 6–23)
CHLORIDE: 106 meq/L (ref 96–112)
CO2: 25 mEq/L (ref 19–32)
CREATININE: 0.86 mg/dL (ref 0.40–1.20)
Calcium: 9.2 mg/dL (ref 8.4–10.5)
GFR: 81.54 mL/min (ref >60.00)
GLUCOSE: 112 mg/dL — AB (ref 70–99)
POTASSIUM: 4.2 meq/L (ref 3.5–5.1)
Sodium: 136 mEq/L (ref 135–145)
Total Bilirubin: 0.3 mg/dL (ref 0.2–1.2)
Total Protein: 6.9 g/dL (ref 6.0–8.3)

## 2017-11-25 LAB — POCT URINALYSIS DIPSTICK
Bilirubin, UA: NEGATIVE
Blood, UA: NEGATIVE
Glucose, UA: NEGATIVE
Ketones, UA: NEGATIVE
LEUKOCYTES UA: NEGATIVE
NITRITE UA: NEGATIVE
PROTEIN UA: NEGATIVE
Spec Grav, UA: 1.025 (ref 1.010–1.025)
UROBILINOGEN UA: 0.2 U/dL
pH, UA: 6 (ref 5.0–8.0)

## 2017-11-25 LAB — CBC
HCT: 42.4 % (ref 36.0–46.0)
HEMOGLOBIN: 13.9 g/dL (ref 12.0–15.0)
MCHC: 32.9 g/dL (ref 30.0–36.0)
MCV: 92.3 fl (ref 78.0–100.0)
PLATELETS: 232 10*3/uL (ref 150.0–400.0)
RBC: 4.6 Mil/uL (ref 3.87–5.11)
RDW: 13 % (ref 11.5–15.5)
WBC: 6.1 10*3/uL (ref 4.0–10.5)

## 2017-11-25 LAB — VITAMIN D 25 HYDROXY (VIT D DEFICIENCY, FRACTURES): VITD: 57 ng/mL (ref 30.00–100.00)

## 2017-11-25 LAB — POCT URINE PREGNANCY: PREG TEST UR: NEGATIVE

## 2017-11-25 LAB — VITAMIN B12: Vitamin B-12: 534 pg/mL (ref 211–911)

## 2017-11-25 LAB — TSH: TSH: 0.54 u[IU]/mL (ref 0.35–4.50)
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
## 2017-11-25 NOTE — Progress Notes (Signed)
    Subjective:  [name] [name] is a 31 y.o. female who presents today with a chief complaint of fatigue.   HPI:  Fatigue, new issue Started about 4 months ago.  Has been stable over that time.  No clear precipitating events.  No recent medication changes.  No fevers or chills.  No appetite changes.  No constipation or diarrhea.  No skin changes.  No recent illnesses.  Patient thought this was initially related to her neck pain however symptoms have persisted after having her neck surgery a couple months ago.  Does not know if she snores.  Will wake up at night feeling short of breath and gasping for air.  No clear aggravating or alleviating factors noted.  ROS: Per HPI  PMH: Smoking history reviewed.  Current smoker.  Objective:  Physical Exam: BP 112/72   Pulse (!) 108   Ht 5\' 3"  (1.6 m)   Wt 108 lb (49 kg)   SpO2 97%   BMI 19.13 kg/m   Gen: NAD, resting comfortably HEENT: TMs clear bilaterally.  Oropharynx erythematous without exudate. CV: Tachycardic with no murmurs appreciated Pulm: NWOB, CTAB with no crackles, wheezes, or rhonchi GI: Normal bowel sounds present. Soft, Nontender, Nondistended. MSK: No edema, cyanosis, or clubbing noted Skin: Warm, dry Neuro: Grossly normal, moves all extremities Psych: Normal affect and thought content  Assessment/Plan:  Fatigue, new issue Broad differential.  Will check CBC, CMET, TSH, vitamin B12, vitamin D, urinalysis, and urine pregnancy for further evaluation.  She may have underlying sleep apnea.  Will need referral for sleep study if above workup negative.  She is also on several sedating medications including Xanax, BuSpar, Flexeril, gabapentin, Lamictal, Robaxin, prazosin, and Seroquel.  We will proceed with above workup to rule out other causes before attributing her symptoms to her medications.  Advised patient to reduce use of Xanax as tolerated to see if this helps.  Katina Degreealeb M. Jimmey RalphParker, MD 11/25/2017 11:24 AM

## 2017-11-25 NOTE — Progress Notes (Signed)
Labs all normal. Please inform patient.  She should talk to her pulmonologist about getting a sleep study performed.  Katina Degreealeb M. Jimmey RalphParker, MD 11/25/2017 4:58 PM

## 2017-11-25 NOTE — Assessment & Plan Note (Signed)
Broad differential.  Will check CBC, CMET, TSH, vitamin B12, vitamin D, urinalysis, and urine pregnancy for further evaluation.  She may have underlying sleep apnea.  Will need referral for sleep study with above workup negative.  She is also on several sedating medications including Xanax, BuSpar, Flexeril, gabapentin, Lamictal, Robaxin, prazosin, and Seroquel.  We will proceed with above workup to rule out other causes before attributing her symptoms to her medications.  Advised patient to reduce use of Xanax as tolerated to see if this helps.

## 2017-11-25 NOTE — Patient Instructions (Signed)

## 2017-11-26 NOTE — Progress Notes (Signed)
Patient does appear to have an effusion of the hip joint which is likely related to possible underlying labral tear.  Intra-articular injection will likely be beneficial for her and I would like to get her in on the schedule tomorrow.  Please call to schedule her for this.Additionally she will need to have a follow-up ultrasound of her ovaries in 6 months.  I do not think that these findings are related to her hip and leg symptoms and are incidental and are most likely physiologic.

## 2017-11-27 ENCOUNTER — Ambulatory Visit: Payer: Self-pay

## 2017-11-27 ENCOUNTER — Ambulatory Visit (INDEPENDENT_AMBULATORY_CARE_PROVIDER_SITE_OTHER): Payer: BLUE CROSS/BLUE SHIELD | Admitting: Sports Medicine

## 2017-11-27 ENCOUNTER — Encounter: Payer: Self-pay | Admitting: Sports Medicine

## 2017-11-27 VITALS — BP 116/78 | HR 86 | Resp 97 | Ht 63.0 in | Wt 110.0 lb

## 2017-11-27 DIAGNOSIS — M25551 Pain in right hip: Secondary | ICD-10-CM

## 2017-11-27 DIAGNOSIS — M25451 Effusion, right hip: Secondary | ICD-10-CM | POA: Diagnosis not present

## 2017-11-27 MED ORDER — IBUPROFEN-FAMOTIDINE 800-26.6 MG PO TABS: 1.0000 | ORAL_TABLET | Freq: Three times a day (TID) | ORAL | 0 refills | Status: DC | PRN

## 2017-11-27 NOTE — Assessment & Plan Note (Signed)
Given the large effusion today and the fact that I was able to appreciate tissue separation with injection today of the anterior labrum I suspect that she is irritated a underlying labral tear and she does not have a mechanism for acute tear.  Recommend avoiding exacerbating activities.    Recheck in 4 weeks to ensure clinical resolution.  Will need to re-ultrasound at that time.  Would consider one additional injection prior to arthrogram. Sample of Duexis provided today for short-term pain relief.  Continue with crutches until symptoms resolve.  Frequent icing recommended.

## 2017-11-27 NOTE — Addendum Note (Signed)
Addended by: Dierdre SearlesOLEMAN, Shernell Saldierna S on: 11/27/2017 11:44 AM   Modules accepted: Orders

## 2017-11-27 NOTE — Procedures (Signed)
PROCEDURE NOTE -  ULTRASOUND GUIDEDInjection: Right hip Images were obtained and interpreted by myself, Gaspar BiddingMichael Metzli Pollick, DO  Images have been saved and stored to PACS system. Images obtained on: GE S7 Ultrasound machine  ULTRASOUND FINDINGS:  Large joint effusion, during the procedure a appreciable split was seen in the anterior labrum  DESCRIPTION OF PROCEDURE:  The patient's clinical condition is marked by substantial pain and/or significant functional disability. Other conservative therapy has not provided relief, is contraindicated, or not appropriate. There is a reasonable likelihood that injection will significantly improve the patient's pain and/or functional impairment.  After discussing the risks, benefits and expected outcomes of the injection and all questions were reviewed and answered, the patient wished to undergo the above named procedure. Verbal consent was obtained.  The ultrasound was used to identify the target structure and adjacent neurovascular structures. The skin was then prepped in sterile fashion and the target structure was injected under direct visualization using sterile technique as below:  Right PREP: Alcohol, Ethel Chloride,  APPROACH: direct, stopcock technique, 22g 1.5in. INJECTATE: 4cc 1% lidocaine, 2cc 0.5% marcaine, 2cc 40mg /mL DepoMedrol  ASPIRATE: N/A DRESSING: Band-Aid    Post procedural instructions including recommending icing and warning signs for infection were reviewed.  This procedure was well tolerated and there were no complications.   IMPRESSION: Succesful US Guided Injection

## 2017-11-27 NOTE — Progress Notes (Signed)
[name] FellsMichael D. Delorise Shinerigby, DO  [facility] [phone]  [name] [name] - 31 y.o. female MRN [id number]  Date of birth: 11/03/1986   Scribe for today's visit: [name] ClinchBrandy [name], CMA    SUBJECTIVE:  [name] [name] is here for Follow-up (RT hip pain)  At this time symptoms are worsening compared to onset. Pain has gotten sharper and occurs more often.  She has been using heat and ice with no relief. She has taken Gabapentin 300 mg TID with no relief. She has tried taking Methocarbamol with no relief.  She has tried Ibuprofen and Tylenol with no relief.    ROS Reports night time disturbances. Denies fevers, chills, or night sweats. Denies unexplained weight loss. Denies personal history of cancer. Reports changes in bowel or bladder habits. Denies recent unreported falls. Denies new or worsening dyspnea or wheezing. Reports headaches or dizziness.  Denies numbness, tingling or weakness  In the extremities.  Denies dizziness or presyncopal episodes Denies lower extremity edema    HISTORY & PERTINENT PRIOR DATA:  Prior History reviewed and updated per electronic medical record. Significant history, findings, studies and interim changes include: Findings:  MRI RT hip 11/25/2017   reports that she has been smoking cigarettes.  She has been smoking about 0.00 packs per day. she has never used smokeless tobacco. Recent Labs    06/30/17 1427  LABURIC 4.0   Problem  Hip Effusion, Right   MRI 11/17/2017: Moderate intra-articular effusion of the right hip.  No evidence of stress fracture.  Questionable labral tear, non-arthrogram exam due to the urgent need to rule out stress fracture      OBJECTIVE:  VS:  HT:5\' 3"  (160 cm)   WT:110 lb (49.9 kg)  BMI:19.49    BP:116/78  HR:86bpm  TEMP: ( )  RESP:   PHYSICAL EXAM: Constitutional: WDWN, Non-toxic appearing. Psychiatric: Alert & appropriately interactive. Not depressed or  anxious appearing. Respiratory: No increased work of breathing. Trachea Midline Eyes: Pupils are equal. EOM intact without nystagmus. No scleral icterus  Right hip has marked pain with any type of active or passive range of motion.  Effusion appreciated on ultrasound  ASSESSMENT & PLAN:   1. Hip effusion, right   2. Right hip pain    Plan:    Hip effusion, right Given the large effusion today and the fact that I was able to appreciate tissue separation with injection today of the anterior labrum I suspect that she is irritated a underlying labral tear and she does not have a mechanism for acute tear.  Recommend avoiding exacerbating activities.    Recheck in 4 weeks to ensure clinical resolution.  Will need to re-ultrasound at that time.  Would consider one additional injection prior to arthrogram. Sample of Duexis provided today for short-term pain relief.  Continue with crutches until symptoms resolve.  Frequent icing recommended.   ++++++++++++++++++++++++++++++++++++++++++++ Orders:  Orders Placed This Encounter  Procedures  . US GUIDED NEEDLE PLACEMENT(NO LINKED CHARGES)    Meds:  No orders of the defined types were placed in this encounter.  Duexis sample provided  ++++++++++++++++++++++++++++++++++++++++++++ Follow-up: Return in about 4 weeks (around 12/25/2017).  Repeat ultrasound at that time  Pertinent documentation may be included in additional procedure notes, imaging studies, problem based documentation and patient instructions. Please see these sections of the encounter for additional information regarding this visit. CMA/ATC served as Neurosurgeonscribe during this visit. History, Physical, and Plan performed by medical provider. Documentation  and orders reviewed and attested to.      [name] [name], [facility] Physician

## 2017-11-27 NOTE — Patient Instructions (Signed)

## 2017-12-20 ENCOUNTER — Other Ambulatory Visit: Payer: Self-pay | Admitting: Physician Assistant

## 2017-12-24 NOTE — Telephone Encounter (Signed)
Dr. Wallace, okay to refill Maxalt? 

## 2017-12-25 ENCOUNTER — Ambulatory Visit (INDEPENDENT_AMBULATORY_CARE_PROVIDER_SITE_OTHER): Payer: BLUE CROSS/BLUE SHIELD | Admitting: Sports Medicine

## 2017-12-25 ENCOUNTER — Encounter: Payer: Self-pay | Admitting: Sports Medicine

## 2017-12-25 ENCOUNTER — Encounter: Payer: Self-pay | Admitting: Neurology

## 2017-12-25 ENCOUNTER — Other Ambulatory Visit: Payer: Self-pay

## 2017-12-25 VITALS — BP 120/70 | HR 115 | Ht 63.0 in | Wt 113.2 lb

## 2017-12-25 DIAGNOSIS — M47816 Spondylosis without myelopathy or radiculopathy, lumbar region: Secondary | ICD-10-CM

## 2017-12-25 DIAGNOSIS — N96 Recurrent pregnancy loss: Secondary | ICD-10-CM | POA: Diagnosis not present

## 2017-12-25 DIAGNOSIS — M25451 Effusion, right hip: Secondary | ICD-10-CM

## 2017-12-25 DIAGNOSIS — R55 Syncope and collapse: Secondary | ICD-10-CM

## 2017-12-25 DIAGNOSIS — Z3201 Encounter for pregnancy test, result positive: Secondary | ICD-10-CM | POA: Diagnosis not present

## 2017-12-25 DIAGNOSIS — S060X0A Concussion without loss of consciousness, initial encounter: Secondary | ICD-10-CM | POA: Diagnosis not present

## 2017-12-25 DIAGNOSIS — J449 Chronic obstructive pulmonary disease, unspecified: Secondary | ICD-10-CM

## 2017-12-25 DIAGNOSIS — M25551 Pain in right hip: Secondary | ICD-10-CM | POA: Diagnosis not present

## 2017-12-25 DIAGNOSIS — S060X1A Concussion with loss of consciousness of 30 minutes or less, initial encounter: Secondary | ICD-10-CM

## 2017-12-25 LAB — HCG, QUANTITATIVE, PREGNANCY: Quantitative HCG: 65.51 m[IU]/mL

## 2017-12-25 LAB — POCT URINE PREGNANCY: PREG TEST UR: POSITIVE — AB

## 2017-12-25 MED ORDER — PRENATAL VITAMIN 27-0.8 MG PO TABS: 1.0000 | ORAL_TABLET | Freq: Every day | ORAL | 8 refills | Status: DC

## 2017-12-25 MED ORDER — CYCLOBENZAPRINE HCL 10 MG PO TABS: 10.0000 mg | ORAL_TABLET | Freq: Three times a day (TID) | ORAL | 1 refills | Status: DC | PRN

## 2017-12-25 MED ORDER — COMPLETENATE 29-1 MG PO CHEW: 1.0000 | CHEWABLE_TABLET | Freq: Every day | ORAL | 1 refills | Status: DC

## 2017-12-25 NOTE — Assessment & Plan Note (Signed)
Patient's last menstrual period was 12/04/2017 (exact date).   She did have a faint positive on urine test and blood quantitative obtained today.  She denies any bleeding or discharge.  She does report that this was an intended pregnancy however is not interested currently in abortion although her husband is leaning towards this way.  She has recently finished Lupron injections.  Referral placed again to [facility].
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
## 2017-12-25 NOTE — Assessment & Plan Note (Signed)
See overview.  Can consider repeat injections if significantly flared up during pregnancy
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
## 2017-12-25 NOTE — Assessment & Plan Note (Signed)
Given current pregnancy recommended once again encouraged to have her cut back and try to quit if possible.  Also recommend avoiding any type of other illicit substance including marijuana.

## 2017-12-29 NOTE — L&D Delivery Note (Signed)
Delivery Note At 3:55 PM a healthy female was delivered via Vaginal, Spontaneous (Presentation: OA ).  APGAR: 9, 9; weight pending .   Placenta status: delivered spontaneously.  Cord:  with the following complications: 2 vessels and corporal x 1 .    Anesthesia: epidural  Episiotomy: None Lacerations: None Skin tags x 3 removed from perineum per pt requested and base treated with silver nitrate Suture Repair: none Est. Blood Loss (mL): 225  Mom to postpartum.  Baby to Couplet care / Skin to Skin.  Oliver Pila 09/07/2018, 4:46 PM

## 2017-12-30 ENCOUNTER — Encounter: Payer: Self-pay | Admitting: Internal Medicine

## 2017-12-30 NOTE — Telephone Encounter (Signed)
Dr. Sherene SiresWert,   Pt is wanting to make sure that her stiolto and combivent are safe for her to continue to take as she just found out that she is pregnant. Please advise on this.  Thanks!

## 2017-12-31 NOTE — Telephone Encounter (Signed)
She should check with her ob -gyn but there are no studies using any of these meds with pregnancy nor established high risks - smoking and bad breathing are much much greater risks to the baby  In any case, needs ov next available to reassess as in this situation we do want her to use the lowest doses of any meds that control the breathing problem well.

## 2018-01-13 ENCOUNTER — Emergency Department (HOSPITAL_COMMUNITY): Payer: BLUE CROSS/BLUE SHIELD

## 2018-01-13 ENCOUNTER — Emergency Department (HOSPITAL_COMMUNITY)
Admission: EM | Admit: 2018-01-13 | Discharge: 2018-01-13 | Disposition: A | Discharge status: Home / Self Care | Payer: BLUE CROSS/BLUE SHIELD | Attending: Emergency Medicine | Admitting: Emergency Medicine

## 2018-01-13 ENCOUNTER — Encounter (HOSPITAL_COMMUNITY): Payer: Self-pay

## 2018-01-13 ENCOUNTER — Other Ambulatory Visit: Payer: Self-pay

## 2018-01-13 DIAGNOSIS — O9989 Other specified diseases and conditions complicating pregnancy, childbirth and the puerperium: Secondary | ICD-10-CM | POA: Diagnosis present

## 2018-01-13 DIAGNOSIS — O99511 Diseases of the respiratory system complicating pregnancy, first trimester: Secondary | ICD-10-CM | POA: Diagnosis not present

## 2018-01-13 DIAGNOSIS — O99331 Smoking (tobacco) complicating pregnancy, first trimester: Secondary | ICD-10-CM | POA: Insufficient documentation

## 2018-01-13 DIAGNOSIS — Z79899 Other long term (current) drug therapy: Secondary | ICD-10-CM | POA: Diagnosis not present

## 2018-01-13 DIAGNOSIS — Z3A01 Less than 8 weeks gestation of pregnancy: Secondary | ICD-10-CM | POA: Insufficient documentation

## 2018-01-13 DIAGNOSIS — Z720 Tobacco use: Secondary | ICD-10-CM

## 2018-01-13 DIAGNOSIS — F17210 Nicotine dependence, cigarettes, uncomplicated: Secondary | ICD-10-CM | POA: Diagnosis not present

## 2018-01-13 DIAGNOSIS — J449 Chronic obstructive pulmonary disease, unspecified: Secondary | ICD-10-CM | POA: Insufficient documentation

## 2018-01-13 DIAGNOSIS — J441 Chronic obstructive pulmonary disease with (acute) exacerbation: Secondary | ICD-10-CM

## 2018-01-13 LAB — I-STAT TROPONIN, ED: TROPONIN I, POC: 0.01 ng/mL (ref 0.00–0.08)

## 2018-01-13 LAB — CBC
HCT: 41.8 % (ref 36.0–46.0)
HEMOGLOBIN: 14.1 g/dL (ref 12.0–15.0)
MCH: 29.6 pg (ref 26.0–34.0)
MCHC: 33.7 g/dL (ref 30.0–36.0)
MCV: 87.6 fL (ref 78.0–100.0)
Platelets: 232 10*3/uL (ref 150–400)
RBC: 4.77 MIL/uL (ref 3.87–5.11)
RDW: 12.4 % (ref 11.5–15.5)
WBC: 10.7 10*3/uL — ABNORMAL HIGH (ref 4.0–10.5)

## 2018-01-13 LAB — BASIC METABOLIC PANEL
ANION GAP: 13 (ref 5–15)
BUN: <5 mg/dL — ABNORMAL LOW (ref 6–20)
CALCIUM: 9.4 mg/dL (ref 8.9–10.3)
CHLORIDE: 103 mmol/L (ref 101–111)
CO2: 22 mmol/L (ref 22–32)
CREATININE: 0.64 mg/dL (ref 0.44–1.00)
GFR calc non Af Amer: >60 mL/min (ref >60)
GLUCOSE: 103 mg/dL — AB (ref 65–99)
Potassium: 3.3 mmol/L — ABNORMAL LOW (ref 3.5–5.1)
Sodium: 138 mmol/L (ref 135–145)

## 2018-01-13 LAB — I-STAT BETA HCG BLOOD, ED (MC, WL, AP ONLY): I-stat hCG, quantitative: >2000 m[IU]/mL — ABNORMAL HIGH (ref <5)

## 2018-01-13 MED ORDER — DEXAMETHASONE SODIUM PHOSPHATE 10 MG/ML IJ SOLN
10.0000 mg | Freq: Once | INTRAMUSCULAR | Status: AC
  Administered 2018-01-13: 10 mg via INTRAMUSCULAR
  Filled 2018-01-13: qty 1

## 2018-01-13 MED ORDER — ONDANSETRON 4 MG PO TBDP: 4.0000 mg | ORAL_TABLET | Freq: Three times a day (TID) | ORAL | 0 refills | Status: DC | PRN

## 2018-01-13 MED ORDER — IPRATROPIUM-ALBUTEROL 0.5-2.5 (3) MG/3ML IN SOLN
3.0000 mL | Freq: Once | RESPIRATORY_TRACT | Status: AC
  Administered 2018-01-13: 3 mL via RESPIRATORY_TRACT
  Filled 2018-01-13: qty 3

## 2018-01-13 MED ORDER — ONDANSETRON HCL 4 MG/2ML IJ SOLN
4.0000 mg | Freq: Once | INTRAMUSCULAR | Status: AC
  Administered 2018-01-13: 4 mg via INTRAVENOUS

## 2018-01-13 MED ORDER — ALBUTEROL SULFATE (2.5 MG/3ML) 0.083% IN NEBU
2.5000 mg | INHALATION_SOLUTION | Freq: Once | RESPIRATORY_TRACT | Status: AC
  Administered 2018-01-13: 2.5 mg via RESPIRATORY_TRACT
  Filled 2018-01-13: qty 3

## 2018-01-13 NOTE — Discharge Instructions (Signed)
Try to stop smoking. °
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
## 2018-01-13 NOTE — ED Triage Notes (Signed)
Pt reports having chest pain and shortness of breath for a few days. States it feels like a weight on her chest. History of COPD and Emphysema.

## 2018-01-13 NOTE — ED Notes (Signed)
Unable to sign dc papers due to registration been on her chart.

## 2018-02-05 ENCOUNTER — Ambulatory Visit (INDEPENDENT_AMBULATORY_CARE_PROVIDER_SITE_OTHER): Payer: BLUE CROSS/BLUE SHIELD | Admitting: Sports Medicine

## 2018-02-05 ENCOUNTER — Encounter: Payer: Self-pay | Admitting: Sports Medicine

## 2018-02-05 VITALS — BP 104/66 | HR 90 | Ht 63.0 in | Wt 108.6 lb

## 2018-02-05 DIAGNOSIS — M25451 Effusion, right hip: Secondary | ICD-10-CM | POA: Diagnosis not present

## 2018-02-05 DIAGNOSIS — M25551 Pain in right hip: Secondary | ICD-10-CM | POA: Diagnosis not present
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
## 2018-02-08 LAB — OB RESULTS CONSOLE HEPATITIS B SURFACE ANTIGEN: Hepatitis B Surface Ag: NEGATIVE

## 2018-02-08 LAB — OB RESULTS CONSOLE HIV ANTIBODY (ROUTINE TESTING): HIV: NONREACTIVE

## 2018-02-08 LAB — OB RESULTS CONSOLE RPR: RPR: NONREACTIVE

## 2018-02-08 LAB — OB RESULTS CONSOLE GC/CHLAMYDIA
CHLAMYDIA, DNA PROBE: NEGATIVE
GC PROBE AMP, GENITAL: NEGATIVE

## 2018-02-08 LAB — OB RESULTS CONSOLE ABO/RH: RH TYPE: POSITIVE

## 2018-02-08 LAB — OB RESULTS CONSOLE RUBELLA ANTIBODY, IGM: Rubella: IMMUNE

## 2018-03-04 ENCOUNTER — Encounter: Payer: Self-pay | Admitting: Sports Medicine

## 2018-03-04 ENCOUNTER — Ambulatory Visit: Payer: Self-pay

## 2018-03-04 ENCOUNTER — Ambulatory Visit (INDEPENDENT_AMBULATORY_CARE_PROVIDER_SITE_OTHER): Payer: BLUE CROSS/BLUE SHIELD | Admitting: Sports Medicine

## 2018-03-04 VITALS — BP 110/56 | HR 100 | Ht 63.0 in | Wt 113.8 lb

## 2018-03-04 DIAGNOSIS — M25451 Effusion, right hip: Secondary | ICD-10-CM | POA: Diagnosis not present

## 2018-03-04 DIAGNOSIS — M25551 Pain in right hip: Secondary | ICD-10-CM | POA: Diagnosis not present

## 2018-03-04 DIAGNOSIS — Z3A13 13 weeks gestation of pregnancy: Secondary | ICD-10-CM | POA: Insufficient documentation
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
## 2018-03-04 NOTE — Patient Instructions (Signed)

## 2018-03-04 NOTE — Procedures (Signed)
PROCEDURE NOTE:  Ultrasound Guided: Injection: right Hip - intra-articular Images were obtained and interpreted by myself, Gaspar BiddingMichael Rigby, DO  Images have been saved and stored to PACS system. Images obtained on: GE S7 Ultrasound machine  ULTRASOUND FINDINGS:  Moderate joint effusion with blunting of anterior labrum - no obvious tear   DESCRIPTION OF PROCEDURE:  The patient's clinical condition is marked by substantial pain and/or significant functional disability. Other conservative therapy has not provided relief, is contraindicated, or not appropriate. There is a reasonable likelihood that injection will significantly improve the patient's pain and/or functional impairment.  After discussing the risks, benefits and expected outcomes of the injection and all questions were reviewed and answered, the patient wished to undergo the above named procedure. Verbal consent was obtained.  The ultrasound was used to identify the target structure and adjacent neurovascular structures. The skin was then prepped in sterile fashion and the target structure was injected under direct visualization using sterile technique as below:  PREP: Alcohol, Ethel Chloride APPROACH: direct, stopcock technique, 22g 3.5in.  INJECTATE: 4cc: 1% lidocaine, 2cc: 0.5% marcaine, 1cc: 40mg  Depo-Medrol + 1cc 40mg  Kenalog   ASPIRATE: None   DRESSING: Band-Aid  Post procedural instructions including recommending icing and warning signs for infection were reviewed.   This procedure was well tolerated and there were no complications.   IMPRESSION: Succesful Ultrasound Guided: Injection

## 2018-03-09 ENCOUNTER — Ambulatory Visit: Payer: BLUE CROSS/BLUE SHIELD | Admitting: Neurology

## 2018-03-27 ENCOUNTER — Encounter: Payer: Self-pay | Admitting: Sports Medicine

## 2018-05-09 ENCOUNTER — Other Ambulatory Visit: Payer: Self-pay | Admitting: Internal Medicine

## 2018-05-11 ENCOUNTER — Encounter: Payer: Self-pay | Admitting: Internal Medicine

## 2018-05-11 ENCOUNTER — Ambulatory Visit (INDEPENDENT_AMBULATORY_CARE_PROVIDER_SITE_OTHER): Payer: BLUE CROSS/BLUE SHIELD | Admitting: Internal Medicine

## 2018-05-11 VITALS — BP 116/60 | HR 87 | Ht 63.0 in | Wt 125.8 lb

## 2018-05-11 DIAGNOSIS — Z3490 Encounter for supervision of normal pregnancy, unspecified, unspecified trimester: Secondary | ICD-10-CM

## 2018-05-11 DIAGNOSIS — K089 Disorder of teeth and supporting structures, unspecified: Secondary | ICD-10-CM | POA: Diagnosis not present

## 2018-05-11 DIAGNOSIS — J449 Chronic obstructive pulmonary disease, unspecified: Secondary | ICD-10-CM

## 2018-05-11 DIAGNOSIS — Z331 Pregnant state, incidental: Secondary | ICD-10-CM | POA: Insufficient documentation

## 2018-05-11 NOTE — Assessment & Plan Note (Addendum)
Alpha one testing 02/02/2017 >  MM  - 02/02/2017   Walked RA  2 laps @ 185 ft each @ nl pace stopped due to  Sob/fatigue sats 99% at end  - Spirometry 02/02/2017  FEV1 2.64 (85%)  Ratio 68  p combivent 2 h before and truncated exp loop  - 05/18/2017  After extensive coaching device effectiveness =    90% with SMI>>  continue respimat stiolto    Strongly suspect she's actively smoking which is clearly more of a concern to fetal health than her inhalers but advised here that there is no data on using stiolto and that at 22 weeks the main issue is avoiding dyspnea to keep 02 sat curve from shifting to the Left and not an issue with organogensis per se at this point  Discussed in detail all the  indications, usual  risks and alternatives  relative to the benefits with patient who agrees to proceed with rx as outlined.    Extra face to face time devoted to issue of risks to fetus of meds vs hypoxia or hyperventilation  Or caboxyhemoglobin exp  I had an extended discussion with the patient reviewing all relevant studies completed to date and  lasting 15 to 20 minutes of a 25 minute visit    Each maintenance medication was reviewed in detail including most importantly the difference between maintenance and prns and under what circumstances the prns are to be triggered using an action plan format that is not reflected in the computer generated alphabetically organized AVS.    Please see AVS for specific instructions unique to this visit that I personally wrote and verbalized to the the pt in detail and then reviewed with pt  by my nurse highlighting any  changes in therapy recommended at today's visit to their plan of care.

## 2018-05-11 NOTE — Patient Instructions (Signed)
Avoid all cigarette smoke exposure   Please schedule a follow up visit in 6 months but call sooner if needed
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
## 2018-05-11 NOTE — Assessment & Plan Note (Addendum)
Due Sep 09 2018   Advised as to dangers of cig smoke exposure, esp hers and her husband's re 02 delivery in later stages of IUP  See copd re med rx during IUP

## 2018-05-11 NOTE — Assessment & Plan Note (Signed)
Says has had 3 removed but has to be put to sleep for the rest and waiting until after IUP

## 2018-05-22 ENCOUNTER — Inpatient Hospital Stay (HOSPITAL_COMMUNITY)
Admission: AD | Admit: 2018-05-22 | Discharge: 2018-05-22 | Disposition: A | Discharge status: Home / Self Care | Payer: BLUE CROSS/BLUE SHIELD | Source: Ambulatory Visit | Attending: Obstetrics and Gynecology | Admitting: Obstetrics and Gynecology

## 2018-05-22 ENCOUNTER — Inpatient Hospital Stay (HOSPITAL_COMMUNITY): Payer: BLUE CROSS/BLUE SHIELD

## 2018-05-22 ENCOUNTER — Encounter (HOSPITAL_COMMUNITY): Payer: Self-pay | Admitting: *Deleted

## 2018-05-22 DIAGNOSIS — K529 Noninfective gastroenteritis and colitis, unspecified: Secondary | ICD-10-CM

## 2018-05-22 DIAGNOSIS — Z3A24 24 weeks gestation of pregnancy: Secondary | ICD-10-CM | POA: Insufficient documentation

## 2018-05-22 DIAGNOSIS — Z87891 Personal history of nicotine dependence: Secondary | ICD-10-CM | POA: Diagnosis not present

## 2018-05-22 DIAGNOSIS — O9989 Other specified diseases and conditions complicating pregnancy, childbirth and the puerperium: Secondary | ICD-10-CM

## 2018-05-22 DIAGNOSIS — R102 Pelvic and perineal pain: Secondary | ICD-10-CM | POA: Diagnosis present

## 2018-05-22 DIAGNOSIS — R109 Unspecified abdominal pain: Secondary | ICD-10-CM | POA: Diagnosis not present

## 2018-05-22 DIAGNOSIS — O9962 Diseases of the digestive system complicating childbirth: Secondary | ICD-10-CM | POA: Insufficient documentation

## 2018-05-22 LAB — CBC WITH DIFFERENTIAL/PLATELET
BASOS ABS: 0 10*3/uL (ref 0.0–0.1)
BASOS PCT: 0 %
Eosinophils Absolute: 0.1 10*3/uL (ref 0.0–0.7)
Eosinophils Relative: 1 %
HEMATOCRIT: 35.8 % — AB (ref 36.0–46.0)
HEMOGLOBIN: 12.3 g/dL (ref 12.0–15.0)
LYMPHS PCT: 23 %
Lymphs Abs: 2.9 10*3/uL (ref 0.7–4.0)
MCH: 31.6 pg (ref 26.0–34.0)
MCHC: 34.4 g/dL (ref 30.0–36.0)
MCV: 92 fL (ref 78.0–100.0)
MONOS PCT: 4 %
Monocytes Absolute: 0.5 10*3/uL (ref 0.1–1.0)
NEUTROS ABS: 8.9 10*3/uL — AB (ref 1.7–7.7)
NEUTROS PCT: 72 %
Platelets: 291 10*3/uL (ref 150–400)
RBC: 3.89 MIL/uL (ref 3.87–5.11)
RDW: 12.6 % (ref 11.5–15.5)
WBC: 12.4 10*3/uL — ABNORMAL HIGH (ref 4.0–10.5)

## 2018-05-22 LAB — AMYLASE: AMYLASE: 42 U/L (ref 28–100)

## 2018-05-22 LAB — URINALYSIS, ROUTINE W REFLEX MICROSCOPIC
Bilirubin Urine: NEGATIVE
Glucose, UA: NEGATIVE mg/dL
Hgb urine dipstick: NEGATIVE
KETONES UR: NEGATIVE mg/dL
Leukocytes, UA: NEGATIVE
NITRITE: NEGATIVE
PH: 7 (ref 5.0–8.0)
Protein, ur: NEGATIVE mg/dL
SPECIFIC GRAVITY, URINE: 1.01 (ref 1.005–1.030)

## 2018-05-22 LAB — WET PREP, GENITAL
CLUE CELLS WET PREP: NONE SEEN
Sperm: NONE SEEN
TRICH WET PREP: NONE SEEN
Yeast Wet Prep HPF POC: NONE SEEN

## 2018-05-22 LAB — COMPREHENSIVE METABOLIC PANEL
ALT: 11 U/L — ABNORMAL LOW (ref 14–54)
AST: 16 U/L (ref 15–41)
Albumin: 3.4 g/dL — ABNORMAL LOW (ref 3.5–5.0)
Alkaline Phosphatase: 66 U/L (ref 38–126)
Anion gap: 13 (ref 5–15)
BUN: 6 mg/dL (ref 6–20)
CHLORIDE: 101 mmol/L (ref 101–111)
CO2: 20 mmol/L — ABNORMAL LOW (ref 22–32)
Calcium: 8.7 mg/dL — ABNORMAL LOW (ref 8.9–10.3)
Creatinine, Ser: 0.56 mg/dL (ref 0.44–1.00)
GFR calc Af Amer: >60 mL/min (ref >60)
GLUCOSE: 106 mg/dL — AB (ref 65–99)
Potassium: 3.6 mmol/L (ref 3.5–5.1)
Sodium: 134 mmol/L — ABNORMAL LOW (ref 135–145)
TOTAL PROTEIN: 6.4 g/dL — AB (ref 6.5–8.1)
Total Bilirubin: 0.2 mg/dL — ABNORMAL LOW (ref 0.3–1.2)

## 2018-05-22 LAB — RAPID URINE DRUG SCREEN, HOSP PERFORMED
Amphetamines: NOT DETECTED
BARBITURATES: NOT DETECTED
Benzodiazepines: NOT DETECTED
Cocaine: NOT DETECTED
OPIATES: NOT DETECTED
TETRAHYDROCANNABINOL: POSITIVE — AB

## 2018-05-22 LAB — LIPASE, BLOOD: Lipase: 26 U/L (ref 11–51)

## 2018-05-22 LAB — FETAL FIBRONECTIN: Fetal Fibronectin: NEGATIVE

## 2018-05-22 MED ORDER — HYDROCODONE-ACETAMINOPHEN 5-325 MG PO TABS
2.0000 | ORAL_TABLET | Freq: Once | ORAL | Status: AC
  Administered 2018-05-22: 2 via ORAL
  Filled 2018-05-22: qty 2

## 2018-05-22 MED ORDER — CYCLOBENZAPRINE HCL 10 MG PO TABS
10.0000 mg | ORAL_TABLET | Freq: Once | ORAL | Status: AC
  Administered 2018-05-22: 10 mg via ORAL
  Filled 2018-05-22: qty 1

## 2018-05-22 MED ORDER — ONDANSETRON 8 MG PO TBDP
8.0000 mg | ORAL_TABLET | Freq: Once | ORAL | Status: AC
  Administered 2018-05-22: 8 mg via ORAL
  Filled 2018-05-22: qty 1

## 2018-05-22 MED ORDER — CYCLOBENZAPRINE HCL 10 MG PO TABS: 10.0000 mg | ORAL_TABLET | Freq: Two times a day (BID) | ORAL | 0 refills | Status: DC | PRN

## 2018-05-22 MED ORDER — ONDANSETRON 8 MG PO TBDP: 8.0000 mg | ORAL_TABLET | Freq: Three times a day (TID) | ORAL | 0 refills | Status: DC | PRN

## 2018-05-22 MED ORDER — LACTATED RINGERS IV BOLUS
500.0000 mL | Freq: Once | INTRAVENOUS | Status: AC
  Administered 2018-05-22: 500 mL via INTRAVENOUS

## 2018-05-22 MED ORDER — SODIUM CHLORIDE 0.9 % IV SOLN
8.0000 mg | Freq: Once | INTRAVENOUS | Status: AC
  Administered 2018-05-22: 8 mg via INTRAVENOUS
  Filled 2018-05-22: qty 4

## 2018-05-22 NOTE — Discharge Instructions (Signed)

## 2018-05-22 NOTE — MAU Note (Signed)
Pt presents to MAU after waking up at 2am with back pain. States it feels similar to when labor started with her last son.  Denies LOF, vaginal bleeding.  Last intercourse  05/19/18

## 2018-05-22 NOTE — Progress Notes (Signed)
Pt removed monitors.
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
## 2018-05-25 LAB — GC/CHLAMYDIA PROBE AMP (~~LOC~~) NOT AT ARMC
CHLAMYDIA, DNA PROBE: NEGATIVE
Neisseria Gonorrhea: NEGATIVE

## 2018-07-06 ENCOUNTER — Other Ambulatory Visit: Payer: Self-pay | Admitting: Internal Medicine

## 2018-08-05 LAB — OB RESULTS CONSOLE GBS: STREP GROUP B AG: NEGATIVE

## 2018-08-25 ENCOUNTER — Telehealth (HOSPITAL_COMMUNITY): Payer: Self-pay | Admitting: *Deleted

## 2018-08-25 NOTE — Telephone Encounter (Signed)
Preadmission screen  

## 2018-08-31 ENCOUNTER — Encounter (HOSPITAL_COMMUNITY): Payer: Self-pay | Admitting: *Deleted

## 2018-08-31 ENCOUNTER — Telehealth (HOSPITAL_COMMUNITY): Payer: Self-pay | Admitting: *Deleted

## 2018-08-31 NOTE — Telephone Encounter (Signed)
Preadmission screen  
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
## 2018-09-07 ENCOUNTER — Encounter (HOSPITAL_COMMUNITY): Payer: Self-pay

## 2018-09-07 ENCOUNTER — Inpatient Hospital Stay (HOSPITAL_COMMUNITY): Payer: BLUE CROSS/BLUE SHIELD | Admitting: Anesthesiology

## 2018-09-07 ENCOUNTER — Other Ambulatory Visit: Payer: Self-pay

## 2018-09-07 ENCOUNTER — Inpatient Hospital Stay (HOSPITAL_COMMUNITY)
Admission: RE | Admit: 2018-09-07 | Discharge: 2018-09-09 | DRG: 807 | Disposition: A | Discharge status: Home / Self Care | Payer: BLUE CROSS/BLUE SHIELD | Attending: Obstetrics and Gynecology | Admitting: Obstetrics and Gynecology

## 2018-09-07 DIAGNOSIS — J449 Chronic obstructive pulmonary disease, unspecified: Secondary | ICD-10-CM | POA: Diagnosis present

## 2018-09-07 DIAGNOSIS — F4310 Post-traumatic stress disorder, unspecified: Secondary | ICD-10-CM | POA: Diagnosis present

## 2018-09-07 DIAGNOSIS — F329 Major depressive disorder, single episode, unspecified: Secondary | ICD-10-CM | POA: Diagnosis present

## 2018-09-07 DIAGNOSIS — D649 Anemia, unspecified: Secondary | ICD-10-CM | POA: Diagnosis present

## 2018-09-07 DIAGNOSIS — O9902 Anemia complicating childbirth: Secondary | ICD-10-CM | POA: Diagnosis present

## 2018-09-07 DIAGNOSIS — O9952 Diseases of the respiratory system complicating childbirth: Secondary | ICD-10-CM | POA: Diagnosis present

## 2018-09-07 DIAGNOSIS — L918 Other hypertrophic disorders of the skin: Secondary | ICD-10-CM | POA: Diagnosis present

## 2018-09-07 DIAGNOSIS — O26893 Other specified pregnancy related conditions, third trimester: Secondary | ICD-10-CM | POA: Diagnosis present

## 2018-09-07 DIAGNOSIS — Z3A39 39 weeks gestation of pregnancy: Secondary | ICD-10-CM | POA: Diagnosis not present

## 2018-09-07 DIAGNOSIS — F17210 Nicotine dependence, cigarettes, uncomplicated: Secondary | ICD-10-CM | POA: Diagnosis present

## 2018-09-07 DIAGNOSIS — Z23 Encounter for immunization: Secondary | ICD-10-CM

## 2018-09-07 DIAGNOSIS — O99334 Smoking (tobacco) complicating childbirth: Secondary | ICD-10-CM | POA: Diagnosis present

## 2018-09-07 DIAGNOSIS — O99344 Other mental disorders complicating childbirth: Secondary | ICD-10-CM | POA: Diagnosis present

## 2018-09-07 HISTORY — DX: Chronic obstructive pulmonary disease, unspecified: J44.9

## 2018-09-07 LAB — TYPE AND SCREEN
ABO/RH(D): A POS
Antibody Screen: NEGATIVE

## 2018-09-07 LAB — CBC
HEMATOCRIT: 34.8 % — AB (ref 36.0–46.0)
HEMOGLOBIN: 11.9 g/dL — AB (ref 12.0–15.0)
MCH: 31.5 pg (ref 26.0–34.0)
MCHC: 34.2 g/dL (ref 30.0–36.0)
MCV: 92.1 fL (ref 78.0–100.0)
Platelets: 267 10*3/uL (ref 150–400)
RBC: 3.78 MIL/uL — AB (ref 3.87–5.11)
RDW: 13.4 % (ref 11.5–15.5)
WBC: 14.1 10*3/uL — ABNORMAL HIGH (ref 4.0–10.5)

## 2018-09-07 LAB — RPR: RPR: NONREACTIVE

## 2018-09-07 MED ORDER — SENNOSIDES-DOCUSATE SODIUM 8.6-50 MG PO TABS
2.0000 | ORAL_TABLET | ORAL | Status: DC
  Filled 2018-09-07 (×2): qty 2

## 2018-09-07 MED ORDER — BUPIVACAINE HCL (PF) 0.25 % IJ SOLN
INTRAMUSCULAR | Status: DC | PRN
  Administered 2018-09-07: 10 mL via EPIDURAL

## 2018-09-07 MED ORDER — SOD CITRATE-CITRIC ACID 500-334 MG/5ML PO SOLN: 30.0000 mL | ORAL | Status: DC | PRN

## 2018-09-07 MED ORDER — PNEUMOCOCCAL VAC POLYVALENT 25 MCG/0.5ML IJ INJ
0.5000 mL | INJECTION | INTRAMUSCULAR | Status: DC
  Filled 2018-09-07: qty 0.5

## 2018-09-07 MED ORDER — FENTANYL CITRATE (PF) 100 MCG/2ML IJ SOLN: 50.0000 ug | INTRAMUSCULAR | Status: DC | PRN

## 2018-09-07 MED ORDER — LAMOTRIGINE 200 MG PO TABS
200.0000 mg | ORAL_TABLET | Freq: Every day | ORAL | Status: DC
  Administered 2018-09-07 – 2018-09-08 (×2): 200 mg via ORAL
  Filled 2018-09-07 (×3): qty 1

## 2018-09-07 MED ORDER — PHENYLEPHRINE 40 MCG/ML (10ML) SYRINGE FOR IV PUSH (FOR BLOOD PRESSURE SUPPORT)
80.0000 ug | PREFILLED_SYRINGE | INTRAVENOUS | Status: DC | PRN
  Filled 2018-09-07: qty 5

## 2018-09-07 MED ORDER — IPRATROPIUM-ALBUTEROL 0.5-2.5 (3) MG/3ML IN SOLN
3.0000 mL | Freq: Four times a day (QID) | RESPIRATORY_TRACT | Status: DC | PRN
  Filled 2018-09-07: qty 3

## 2018-09-07 MED ORDER — ACETAMINOPHEN 325 MG PO TABS: 650.0000 mg | ORAL_TABLET | ORAL | Status: DC | PRN

## 2018-09-07 MED ORDER — EPHEDRINE 5 MG/ML INJ
10.0000 mg | INTRAVENOUS | Status: DC | PRN
  Filled 2018-09-07: qty 2

## 2018-09-07 MED ORDER — BENZOCAINE-MENTHOL 20-0.5 % EX AERO: 1.0000 "application " | INHALATION_SPRAY | CUTANEOUS | Status: DC | PRN

## 2018-09-07 MED ORDER — DIPHENHYDRAMINE HCL 25 MG PO CAPS: 25.0000 mg | ORAL_CAPSULE | Freq: Four times a day (QID) | ORAL | Status: DC | PRN

## 2018-09-07 MED ORDER — DIPHENHYDRAMINE HCL 50 MG/ML IJ SOLN
12.5000 mg | INTRAMUSCULAR | Status: DC | PRN
  Administered 2018-09-07 (×2): 12.5 mg via INTRAVENOUS
  Filled 2018-09-07: qty 1

## 2018-09-07 MED ORDER — ONDANSETRON HCL 4 MG/2ML IJ SOLN: 4.0000 mg | Freq: Four times a day (QID) | INTRAMUSCULAR | Status: DC | PRN

## 2018-09-07 MED ORDER — OXYTOCIN BOLUS FROM INFUSION
500.0000 mL | Freq: Once | INTRAVENOUS | Status: AC
  Administered 2018-09-07: 500 mL via INTRAVENOUS

## 2018-09-07 MED ORDER — OXYTOCIN 40 UNITS IN LACTATED RINGERS INFUSION - SIMPLE MED: 2.5000 [IU]/h | INTRAVENOUS | Status: DC

## 2018-09-07 MED ORDER — OXYCODONE-ACETAMINOPHEN 5-325 MG PO TABS: 2.0000 | ORAL_TABLET | ORAL | Status: DC | PRN

## 2018-09-07 MED ORDER — LACTATED RINGERS IV SOLN
INTRAVENOUS | Status: DC
  Administered 2018-09-07: 07:00:00 via INTRAVENOUS

## 2018-09-07 MED ORDER — SALINE SPRAY 0.65 % NA SOLN
2.0000 | NASAL | Status: DC | PRN
  Filled 2018-09-07: qty 44

## 2018-09-07 MED ORDER — TERBUTALINE SULFATE 1 MG/ML IJ SOLN
0.2500 mg | Freq: Once | INTRAMUSCULAR | Status: DC | PRN
  Filled 2018-09-07: qty 1

## 2018-09-07 MED ORDER — IPRATROPIUM-ALBUTEROL 20-100 MCG/ACT IN AERS
1.0000 | INHALATION_SPRAY | Freq: Four times a day (QID) | RESPIRATORY_TRACT | Status: DC | PRN
  Filled 2018-09-07: qty 4

## 2018-09-07 MED ORDER — LACTATED RINGERS IV SOLN: 500.0000 mL | Freq: Once | INTRAVENOUS | Status: DC

## 2018-09-07 MED ORDER — QUETIAPINE FUMARATE 200 MG PO TABS
400.0000 mg | ORAL_TABLET | Freq: Every day | ORAL | Status: DC
  Administered 2018-09-07 – 2018-09-08 (×2): 400 mg via ORAL
  Filled 2018-09-07: qty 1
  Filled 2018-09-07: qty 2

## 2018-09-07 MED ORDER — PRENATAL MULTIVITAMIN CH
1.0000 | ORAL_TABLET | Freq: Every day | ORAL | Status: DC
  Administered 2018-09-08: 1 via ORAL
  Filled 2018-09-07: qty 1

## 2018-09-07 MED ORDER — TETANUS-DIPHTH-ACELL PERTUSSIS 5-2.5-18.5 LF-MCG/0.5 IM SUSP
0.5000 mL | Freq: Once | INTRAMUSCULAR | Status: DC
  Filled 2018-09-07: qty 0.5

## 2018-09-07 MED ORDER — VITAMIN K1 1 MG/0.5ML IJ SOLN
INTRAMUSCULAR | Status: AC
  Filled 2018-09-07: qty 0.5

## 2018-09-07 MED ORDER — SIMETHICONE 80 MG PO CHEW: 80.0000 mg | CHEWABLE_TABLET | ORAL | Status: DC | PRN

## 2018-09-07 MED ORDER — ZOLPIDEM TARTRATE 5 MG PO TABS: 5.0000 mg | ORAL_TABLET | Freq: Every evening | ORAL | Status: DC | PRN

## 2018-09-07 MED ORDER — TIOTROPIUM BROMIDE-OLODATEROL 2.5-2.5 MCG/ACT IN AERS
INHALATION_SPRAY | Freq: Every morning | RESPIRATORY_TRACT | Status: DC
  Administered 2018-09-08: 09:00:00 via RESPIRATORY_TRACT
  Filled 2018-09-07: qty 4

## 2018-09-07 MED ORDER — DIBUCAINE 1 % RE OINT: 1.0000 "application " | TOPICAL_OINTMENT | RECTAL | Status: DC | PRN

## 2018-09-07 MED ORDER — OXYTOCIN 40 UNITS IN LACTATED RINGERS INFUSION - SIMPLE MED
1.0000 m[IU]/min | INTRAVENOUS | Status: DC
  Administered 2018-09-07: 2 m[IU]/min via INTRAVENOUS
  Filled 2018-09-07: qty 1000

## 2018-09-07 MED ORDER — ONDANSETRON HCL 4 MG PO TABS: 4.0000 mg | ORAL_TABLET | ORAL | Status: DC | PRN

## 2018-09-07 MED ORDER — FENTANYL 2.5 MCG/ML BUPIVACAINE 1/10 % EPIDURAL INFUSION (WH - ANES)
14.0000 mL/h | INTRAMUSCULAR | Status: DC | PRN
  Administered 2018-09-07: 14 mL/h via EPIDURAL
  Filled 2018-09-07: qty 100

## 2018-09-07 MED ORDER — IBUPROFEN 600 MG PO TABS
600.0000 mg | ORAL_TABLET | Freq: Four times a day (QID) | ORAL | Status: DC
  Administered 2018-09-07 – 2018-09-09 (×6): 600 mg via ORAL
  Filled 2018-09-07 (×6): qty 1

## 2018-09-07 MED ORDER — LIDOCAINE HCL (PF) 1 % IJ SOLN
30.0000 mL | INTRAMUSCULAR | Status: DC | PRN
  Filled 2018-09-07: qty 30

## 2018-09-07 MED ORDER — WITCH HAZEL-GLYCERIN EX PADS: 1.0000 "application " | MEDICATED_PAD | CUTANEOUS | Status: DC | PRN

## 2018-09-07 MED ORDER — PHENYLEPHRINE 40 MCG/ML (10ML) SYRINGE FOR IV PUSH (FOR BLOOD PRESSURE SUPPORT)
80.0000 ug | PREFILLED_SYRINGE | INTRAVENOUS | Status: DC | PRN
  Filled 2018-09-07: qty 5
  Filled 2018-09-07: qty 10

## 2018-09-07 MED ORDER — LIDOCAINE HCL (PF) 1 % IJ SOLN
INTRAMUSCULAR | Status: DC | PRN
  Administered 2018-09-07 (×2): 5 mL via EPIDURAL

## 2018-09-07 MED ORDER — COCONUT OIL OIL: 1.0000 "application " | TOPICAL_OIL | Status: DC | PRN

## 2018-09-07 MED ORDER — ONDANSETRON HCL 4 MG/2ML IJ SOLN: 4.0000 mg | INTRAMUSCULAR | Status: DC | PRN

## 2018-09-07 MED ORDER — OXYCODONE-ACETAMINOPHEN 5-325 MG PO TABS: 1.0000 | ORAL_TABLET | ORAL | Status: DC | PRN

## 2018-09-07 MED ORDER — LACTATED RINGERS IV SOLN
500.0000 mL | INTRAVENOUS | Status: DC | PRN
  Administered 2018-09-07: 500 mL via INTRAVENOUS

## 2018-09-07 NOTE — Anesthesia Procedure Notes (Signed)
Epidural Patient location during procedure: OB Start time: 09/07/2018 11:12 AM End time: 09/07/2018 11:15 AM  Staffing Anesthesiologist: Leilani Able, MD Performed: anesthesiologist   Preanesthetic Checklist Completed: patient identified, site marked, surgical consent, pre-op evaluation, timeout performed, IV checked, risks and benefits discussed and monitors and equipment checked  Epidural Patient position: sitting Prep: site prepped and draped and DuraPrep Patient monitoring: continuous pulse ox and blood pressure Approach: midline Location: L3-L4 Injection technique: LOR air  Needle:  Needle type: Tuohy  Needle gauge: 17 G Needle length: 9 cm and 9 Needle insertion depth: 5 cm cm Catheter type: closed end flexible Catheter size: 19 Gauge Catheter at skin depth: 10 cm Test dose: negative and Other  Assessment Sensory level: T9 Events: blood not aspirated, injection not painful, no injection resistance, negative IV test and no paresthesia  Additional Notes Reason for block:procedure for pain

## 2018-09-07 NOTE — Progress Notes (Signed)
Patient ID: [name] [name], female   DOB: June 16, 1986, 32 y.o.   MRN: [id number] Feeling mild contractions Plans an epidural  afeb VSS FHR Category 1  50/2.5/-2  AROM light meconium  Pitocin going, increase as needed

## 2018-09-07 NOTE — Anesthesia Preprocedure Evaluation (Signed)
Anesthesia Evaluation  Patient identified by MRN, date of birth, ID band Patient awake    Reviewed: Allergy & Precautions, NPO status , Patient's Chart, lab work & pertinent test results  Airway Mallampati: I  TM Distance: >3 FB Neck ROM: Full    Dental no notable dental hx. (+) Teeth Intact   Pulmonary Current Smoker, former smoker,    Pulmonary exam normal breath sounds clear to auscultation       Cardiovascular negative cardio ROS Normal cardiovascular exam Rhythm:Regular Rate:Normal     Neuro/Psych  Headaches, PSYCHIATRIC DISORDERS Anxiety Depression PTSD   GI/Hepatic negative GI ROS, Neg liver ROS,   Endo/Other  negative endocrine ROS  Renal/GU negative Renal ROS  negative genitourinary   Musculoskeletal negative musculoskeletal ROS (+)   Abdominal Normal abdominal exam  (+)   Peds  Hematology  (+) anemia ,   Anesthesia Other Findings   Reproductive/Obstetrics (+) Pregnancy                             Anesthesia Physical  Anesthesia Plan  ASA: II  Anesthesia Plan: Epidural   Post-op Pain Management:    Induction:   PONV Risk Score and Plan:   Airway Management Planned: Natural Airway  Additional Equipment:   Intra-op Plan:   Post-operative Plan:   Informed Consent: I have reviewed the patients History and Physical, chart, labs and discussed the procedure including the risks, benefits and alternatives for the proposed anesthesia with the patient or authorized representative who has indicated his/her understanding and acceptance.     Plan Discussed with: Anesthesiologist  Anesthesia Plan Comments:         Anesthesia Quick Evaluation

## 2018-09-07 NOTE — Progress Notes (Signed)
Patient ID: [name] [name], female   DOB: 03-19-1986, 32 y.o.   MRN: [id number] Pt received epidural and comfortable  After epidural IUPC and FSE placed  Fetal arrhythmia noted intermittently Placed on far left side and baseline 150-160 with good variability, mild variables  Adequate contractions noted on 77mu of pitocin Follow progress

## 2018-09-08 LAB — CBC
HEMATOCRIT: 29.8 % — AB (ref 36.0–46.0)
HEMOGLOBIN: 10.4 g/dL — AB (ref 12.0–15.0)
MCH: 32 pg (ref 26.0–34.0)
MCHC: 34.9 g/dL (ref 30.0–36.0)
MCV: 91.7 fL (ref 78.0–100.0)
Platelets: 210 10*3/uL (ref 150–400)
RBC: 3.25 MIL/uL — ABNORMAL LOW (ref 3.87–5.11)
RDW: 13.4 % (ref 11.5–15.5)
WBC: 12.2 10*3/uL — AB (ref 4.0–10.5)

## 2018-09-08 MED ORDER — INFLUENZA VAC SPLIT QUAD 0.5 ML IM SUSY
0.5000 mL | PREFILLED_SYRINGE | INTRAMUSCULAR | Status: AC
  Administered 2018-09-08: 0.5 mL via INTRAMUSCULAR
  Filled 2018-09-08: qty 0.5

## 2018-09-08 MED ORDER — PNEUMOCOCCAL VAC POLYVALENT 25 MCG/0.5ML IJ INJ
0.5000 mL | INJECTION | INTRAMUSCULAR | Status: AC
  Administered 2018-09-08: 0.5 mL via INTRAMUSCULAR

## 2018-09-08 NOTE — Progress Notes (Signed)
BP 150-150/90s all day Advised I do not recommend discharge today

## 2018-09-08 NOTE — Lactation Note (Signed)
This note was copied from a baby's chart. Lactation Consultation Note  Patient Name: [name] [name] Date: 09/08/2018 Reason for consult: Initial assessment;Infant < 6lbs;Term Breastfeeding consultation services and support information given and reviewed.  Mom is experienced breastfeeding previous babies.  Newborn is 2 hours old and latching well per mom.  Reviewed basics with mom.  Recommended she begin pumping every 3 hours with symphony pump to stimulate milk supply.  Pump set up.  Mom is doing skin to skin after bath and will begin pumping after.  Instructions given.  Encouraged to call with concerns or for assist. Prn.  Maternal Data Has patient been taught Hand Expression?: Yes Does the patient have breastfeeding experience prior to this delivery?: Yes  Feeding Feeding Type: (enc mother to feed every 3 hours due to size) Length of feed: 0 min  LATCH Score                   Interventions    Lactation Tools Discussed/Used WIC Program: Yes Pump Review: Setup, frequency, and cleaning;Milk Storage Initiated by:: LM Date initiated:: 09/08/18   Consult Status Consult Status: Follow-up Date: 09/09/18 Follow-up type: In-patient    Huston Foley 09/08/2018, 11:06 AM

## 2018-09-08 NOTE — Progress Notes (Signed)
MOB was referred for history of depression/anxiety. * Referral screened out by Clinical Social Worker because none of the following criteria appear to apply: ~ History of anxiety/depression during this pregnancy, or of post-partum depression following prior delivery. ~ Diagnosis of anxiety and/or depression within last 3 years OR * MOB's symptoms currently being treated with medication and/or therapy. MOB is currently taking Lamictal and Seroquel.  Please contact the Clinical Social Worker if needs arise, by [facility] request, or if MOB scores greater than 9/yes to question 10 on Edinburgh Postpartum Depression Screen.  Blaine Hamper, MSW, LCSW Clinical Social Work [phone]

## 2018-09-08 NOTE — Anesthesia Postprocedure Evaluation (Signed)
Anesthesia Post Note  Patient: [name] [name]  Procedure(s) Performed: AN AD HOC LABOR EPIDURAL     Patient location during evaluation: Mother Baby Anesthesia Type: Epidural Level of consciousness: awake and alert Pain management: pain level controlled Vital Signs Assessment: post-procedure vital signs reviewed and stable Respiratory status: spontaneous breathing, nonlabored ventilation and respiratory function stable Cardiovascular status: stable Postop Assessment: no headache, no backache, epidural receding, patient able to bend at knees, no apparent nausea or vomiting, adequate PO intake and able to ambulate Anesthetic complications: no    Last Vitals:  Vitals:   09/07/18 2253 09/08/18 0406  BP: (!) 129/93 122/75  Pulse: 73 79  Resp: 16 16  Temp: 36.7 C 36.7 C  SpO2: 99% 98%    Last Pain:  Vitals:   09/08/18 0406  TempSrc: Oral  PainSc:    Pain Goal:                 Diana Armijo

## 2018-09-08 NOTE — Progress Notes (Signed)
Notified Dr. Jackelyn Knife of afternoon blood pressure of 153/92. Continue blood pressure checks every 4 hours. Earl Gala, Linda Hedges [place]

## 2018-09-08 NOTE — Progress Notes (Signed)
Post Partum Day #1 Subjective: no complaints  Objective: Blood pressure (!) 140/93, pulse 75, temperature 98.3 F (36.8 C), temperature source Oral, resp. rate 18, height 5\' 3"  (1.6 m), weight 69.9 kg, last menstrual period 12/04/2017, SpO2 98 %, unknown if currently breastfeeding.  Physical Exam:  General: alert Lochia: appropriate Uterine Fundus: firm   Recent Labs    09/07/18 0707 09/08/18 0640  HGB 11.9* 10.4*  HCT 34.8* 29.8*    Assessment/Plan: Doing ok, BP mildly elevated, no PIH symptoms.  Will monitor BP today, consider discharge this pm only if BP normal   LOS: 1 day   Zenaida Niece 09/08/2018, 9:13 AM

## 2018-09-09 MED ORDER — IBUPROFEN 600 MG PO TABS: 600.0000 mg | ORAL_TABLET | Freq: Four times a day (QID) | ORAL | 3 refills | Status: AC | PRN

## 2018-09-09 MED ORDER — PRENATAL VITAMIN 27-0.8 MG PO TABS: 1.0000 | ORAL_TABLET | Freq: Every day | ORAL | 3 refills | Status: AC

## 2018-09-09 NOTE — Lactation Note (Signed)
This note was copied from a baby's chart. Lactation Consultation Note  Patient Name: [name] [name] ZOXWR'UToday's Date: 09/09/2018 Reason for consult: Follow-up assessment;Term  P5 mother whose infant is now 4741 hours old.  Mother is looking forward to discharge.  Mother has no questions/concerns related to breastfeeding.  She feels like baby has been feeding well.  Her breasts and nipples are in good condition.  She has a manual pump and a DEBP for home use.  Engorgement prevention/treatment discussed.  She stated that she became engorged with her first child and remembers how it was very painful.  She did not have a problem with any of her other children.  She will call as needed for any further questions.  Father present.   Maternal Data Formula Feeding for Exclusion: No Has patient been taught Hand Expression?: Yes Does the patient have breastfeeding experience prior to this delivery?: Yes  Feeding Feeding Type: Breast Fed Length of feed: 15 min  LATCH Score                   Interventions Interventions: (encouraged pumpig and supplementing)  Lactation Tools Discussed/Used     Consult Status Consult Status: Complete Date: 09/09/18 Follow-up type: In-patient    Lenwood Balsam R Sheikh Leverich 09/09/2018, 9:08 AM

## 2018-09-09 NOTE — Discharge Summary (Signed)
OB Discharge Summary     Patient Name: [name] [name] DOB: October 25, 1986 MRN: [id number]  Date of admission: 09/07/2018 Delivering MD: Huel Cote   Date of discharge: 09/09/2018  Admitting diagnosis: INDUCTION Intrauterine pregnancy: [redacted]w[redacted]d     Secondary diagnosis:  Active Problems:   Indication for care in labor and delivery, antepartum   NSVD (normal spontaneous vaginal delivery)  Additional problems: N/A     Discharge diagnosis: Term Pregnancy Delivered                                                                                                Post partum procedures:none  Augmentation: AROM and Pitocin  Complications: None  Hospital course:  Induction of Labor With Vaginal Delivery   32 y.o. yo [id number] at [redacted]w[redacted]d was admitted to the hospital 09/07/2018 for induction of labor.  Indication for induction: Favorable cervix at term.  Patient had an uncomplicated labor course as follows: Membrane Rupture Time/Date: 10:10 AM ,09/07/2018   Intrapartum Procedures: Episiotomy: None [1]                                         Lacerations:  None [1]  Patient had delivery of a Viable infant.  Information for the patient's newborn:  Sabiha, Sura [[id number]]  Delivery Method: Vaginal, Spontaneous(Filed from Delivery Summary)   09/07/2018  Details of delivery can be found in separate delivery note.  Patient had a routine postpartum course. Patient is discharged home 09/09/18.  Physical exam  Vitals:   09/08/18 1628 09/08/18 2037 09/09/18 0029 09/09/18 0430  BP: (!) 153/92 (!) 151/90 136/87 128/78  Pulse:  65 88 78  Resp:  20 18 20   Temp:  98 F (36.7 C) 98.1 F (36.7 C) 97.7 F (36.5 C)  TempSrc:  Oral Oral Oral  SpO2:      Weight:      Height:       General: alert and no distress Lochia: appropriate Uterine Fundus: firm  Labs: Lab Results  Component Value Date   WBC 12.2 (H) 09/08/2018   HGB 10.4 (L) 09/08/2018   HCT 29.8 (L) 09/08/2018   MCV 91.7  09/08/2018   PLT 210 09/08/2018   CMP Latest Ref Rng & Units 05/22/2018  Glucose 65 - 99 mg/dL 308(M)  BUN 6 - 20 mg/dL 6  Creatinine 5.78 - 4.69 mg/dL 6.29  Sodium 528 - 413 mmol/L 134(L)  Potassium 3.5 - 5.1 mmol/L 3.6  Chloride 101 - 111 mmol/L 101  CO2 22 - 32 mmol/L 20(L)  Calcium 8.9 - 10.3 mg/dL 2.4(M)  Total Protein 6.5 - 8.1 g/dL 6.4(L)  Total Bilirubin 0.3 - 1.2 mg/dL 0.1(U)  Alkaline Phos 38 - 126 U/L 66  AST 15 - 41 U/L 16  ALT 14 - 54 U/L 11(L)    Discharge instruction: per After Visit Summary and "Baby and Me Booklet".  After visit meds:  Allergies as of 09/09/2018      Reactions   Ketorolac Other (See  Comments)   Tablets cause stomach cramps (injections ok)      Medication List    STOP taking these medications   cyclobenzaprine 10 MG tablet Commonly known as:  FLEXERIL   FLUTTER Devi   ondansetron 4 MG disintegrating tablet Commonly known as:  ZOFRAN-ODT   ondansetron 8 MG disintegrating tablet Commonly known as:  ZOFRAN-ODT     TAKE these medications   ALPRAZolam 0.5 MG tablet Commonly known as:  XANAX Take 0.5 mg by mouth 3 (three) times daily as needed for anxiety.   COMBIVENT RESPIMAT 20-100 MCG/ACT Aers respimat Generic drug:  Ipratropium-Albuterol INHALE 1 PUFF BY MOUTH EVERY 6 HOURS   ibuprofen 600 MG tablet Commonly known as:  ADVIL,MOTRIN Take 1 tablet (600 mg total) by mouth every 6 (six) hours as needed for moderate pain.   lamoTRIgine 200 MG tablet Commonly known as:  LAMICTAL Take 200 mg daily by mouth.   Prenatal Vitamin 27-0.8 MG Tabs Take 1 tablet by mouth daily.   QUEtiapine 400 MG tablet Commonly known as:  SEROQUEL Take 400 mg by mouth at bedtime.   sodium chloride 0.65 % Soln nasal spray Commonly known as:  OCEAN Place 2 sprays into both nostrils as needed for congestion.   STIOLTO RESPIMAT 2.5-2.5 MCG/ACT Aers Generic drug:  Tiotropium Bromide-Olodaterol INHALE 2 PUFFS BY MOUTH ONCE DAILY       Diet:  routine diet  Activity: Advance as tolerated. Pelvic rest for 6 weeks.   Outpatient follow up:1 and 6 weeks Follow up Appt: Future Appointments  Date Time Provider Department Center  11/11/2018  9:00 AM Nyoka CowdenWert, Michael B, MD [facility] None   Follow up Visit:No follow-ups on file.  Postpartum contraception: Undecided  Newborn Data: Live born female  Birth Weight: 5 lb 14.4 oz (2676 g) APGAR: 9, 9  Newborn Delivery   Birth date/time:  09/07/2018 15:55:00 Delivery type:  Vaginal, Spontaneous     Baby Feeding: Breast Disposition:home with mother   09/09/2018 Sherian ReinJody Bovard-Stuckert, MD

## 2018-09-09 NOTE — Progress Notes (Signed)
Post Partum Day 2 Subjective: no complaints, up ad lib, voiding, tolerating PO and nl lochia, pain controlled  Objective: Blood pressure 128/78, pulse 78, temperature 97.7 F (36.5 C), temperature source Oral, resp. rate 20, height 5\' 3"  (1.6 m), weight 69.9 kg, last menstrual period 12/04/2017, SpO2 98 %, unknown if currently breastfeeding.  Physical Exam:  General: alert and no distress Lochia: appropriate Uterine Fundus: firm   Recent Labs    09/07/18 0707 09/08/18 0640  HGB 11.9* 10.4*  HCT 34.8* 29.8*    Assessment/Plan: Discharge home, Breastfeeding and Lactation consult.  Routine PP care.  BP check - 1 wk.  D/C with Motrin and PNV.     LOS: 2 days   Elizeth Weinrich Bovard-Stuckert 09/09/2018, 8:59 AM

## 2018-09-09 NOTE — Lactation Note (Signed)
This note was copied from a baby's chart. Lactation Consultation Note Baby 38 hrs. Old. Mom not pumping. Encouraged to pump for stimulation and baby less than 6 lbs.  Mom not supplementing. Encouraged to pump, massage breast and give baby supplement after feeding. LPI information sheet for supplementing d/t less than 6 lbs baby given and reviewed. Discussed options of ways to supplement. Mom stated the baby has been feeding fine and didn't think she needed anything. Discussed reasoning. Encouraged hand expression.  Patient Name: Girl Joni ReiningSherrie Tamburro ZOXWR'UToday's Date: 09/09/2018 Reason for consult: Follow-up assessment;Infant < 6lbs   Maternal Data    Feeding    LATCH Score                   Interventions Interventions: (encouraged pumpig and supplementing)  Lactation Tools Discussed/Used     Consult Status Consult Status: Follow-up Date: 09/09/18 Follow-up type: In-patient    Charyl DancerCARVER, Avian Konigsberg G 09/09/2018, 6:02 AM

## 2018-09-15 ENCOUNTER — Encounter (HOSPITAL_COMMUNITY): Payer: Self-pay | Admitting: *Deleted

## 2018-09-15 ENCOUNTER — Inpatient Hospital Stay (HOSPITAL_COMMUNITY)
Admission: AD | Admit: 2018-09-15 | Discharge: 2018-09-15 | Disposition: A | Discharge status: Home / Self Care | Payer: BLUE CROSS/BLUE SHIELD | Source: Ambulatory Visit | Attending: Obstetrics and Gynecology | Admitting: Obstetrics and Gynecology

## 2018-09-15 DIAGNOSIS — O1415 Severe pre-eclampsia, complicating the puerperium: Secondary | ICD-10-CM

## 2018-09-15 LAB — CBC
HEMATOCRIT: 39.2 % (ref 36.0–46.0)
Hemoglobin: 13.4 g/dL (ref 12.0–15.0)
MCH: 31.3 pg (ref 26.0–34.0)
MCHC: 34.2 g/dL (ref 30.0–36.0)
MCV: 91.6 fL (ref 78.0–100.0)
Platelets: 324 10*3/uL (ref 150–400)
RBC: 4.28 MIL/uL (ref 3.87–5.11)
RDW: 12.9 % (ref 11.5–15.5)
WBC: 8.8 10*3/uL (ref 4.0–10.5)

## 2018-09-15 LAB — COMPREHENSIVE METABOLIC PANEL
ALT: 19 U/L (ref 0–44)
AST: 24 U/L (ref 15–41)
Albumin: 3.5 g/dL (ref 3.5–5.0)
Alkaline Phosphatase: 103 U/L (ref 38–126)
Anion gap: 10 (ref 5–15)
BILIRUBIN TOTAL: 0.2 mg/dL — AB (ref 0.3–1.2)
BUN: 11 mg/dL (ref 6–20)
CO2: 22 mmol/L (ref 22–32)
CREATININE: 0.75 mg/dL (ref 0.44–1.00)
Calcium: 8.6 mg/dL — ABNORMAL LOW (ref 8.9–10.3)
Chloride: 106 mmol/L (ref 98–111)
GFR calc non Af Amer: >60 mL/min (ref >60)
Glucose, Bld: 90 mg/dL (ref 70–99)
POTASSIUM: 3.5 mmol/L (ref 3.5–5.1)
Sodium: 138 mmol/L (ref 135–145)
TOTAL PROTEIN: 6.5 g/dL (ref 6.5–8.1)

## 2018-09-15 LAB — PROTEIN / CREATININE RATIO, URINE
CREATININE, URINE: 107 mg/dL
Protein Creatinine Ratio: 0.2 mg/mg{Cre} — ABNORMAL HIGH (ref 0.00–0.15)
TOTAL PROTEIN, URINE: 21 mg/dL

## 2018-09-15 LAB — TYPE AND SCREEN
ABO/RH(D): A POS
Antibody Screen: NEGATIVE

## 2018-09-15 MED ORDER — CALCIUM CARBONATE ANTACID 500 MG PO CHEW: 2.0000 | CHEWABLE_TABLET | ORAL | Status: DC | PRN

## 2018-09-15 MED ORDER — HYDRALAZINE HCL 20 MG/ML IJ SOLN: 10.0000 mg | INTRAMUSCULAR | Status: DC | PRN

## 2018-09-15 MED ORDER — LABETALOL HCL 5 MG/ML IV SOLN: 80.0000 mg | INTRAVENOUS | Status: DC | PRN

## 2018-09-15 MED ORDER — LABETALOL HCL 5 MG/ML IV SOLN
20.0000 mg | INTRAVENOUS | Status: DC | PRN
  Administered 2018-09-15: 20 mg via INTRAVENOUS
  Filled 2018-09-15: qty 4

## 2018-09-15 MED ORDER — NIFEDIPINE ER OSMOTIC RELEASE 30 MG PO TB24: 60.0000 mg | ORAL_TABLET | Freq: Once | ORAL | Status: DC

## 2018-09-15 MED ORDER — LABETALOL HCL 5 MG/ML IV SOLN: 40.0000 mg | INTRAVENOUS | Status: DC | PRN

## 2018-09-15 MED ORDER — MAGNESIUM SULFATE BOLUS VIA INFUSION
4.0000 g | Freq: Once | INTRAVENOUS | Status: DC
  Filled 2018-09-15: qty 500

## 2018-09-15 MED ORDER — DOCUSATE SODIUM 100 MG PO CAPS: 100.0000 mg | ORAL_CAPSULE | Freq: Every day | ORAL | Status: DC

## 2018-09-15 MED ORDER — NIFEDIPINE ER 30 MG PO TB24: 30.0000 mg | ORAL_TABLET | Freq: Every day | ORAL | 1 refills | Status: AC

## 2018-09-15 MED ORDER — LABETALOL HCL 5 MG/ML IV SOLN: 20.0000 mg | INTRAVENOUS | Status: DC | PRN

## 2018-09-15 MED ORDER — ACETAMINOPHEN 325 MG PO TABS: 650.0000 mg | ORAL_TABLET | ORAL | Status: DC | PRN

## 2018-09-15 MED ORDER — ZOLPIDEM TARTRATE 5 MG PO TABS: 5.0000 mg | ORAL_TABLET | Freq: Every evening | ORAL | Status: DC | PRN

## 2018-09-15 MED ORDER — LABETALOL HCL 5 MG/ML IV SOLN
40.0000 mg | INTRAVENOUS | Status: DC | PRN
  Administered 2018-09-15: 40 mg via INTRAVENOUS
  Filled 2018-09-15: qty 8

## 2018-09-15 MED ORDER — NIFEDIPINE ER OSMOTIC RELEASE 30 MG PO TB24
30.0000 mg | ORAL_TABLET | Freq: Once | ORAL | Status: AC
  Administered 2018-09-15: 30 mg via ORAL
  Filled 2018-09-15: qty 1

## 2018-09-15 MED ORDER — PRENATAL MULTIVITAMIN CH: 1.0000 | ORAL_TABLET | Freq: Every day | ORAL | Status: DC

## 2018-09-15 MED ORDER — MAGNESIUM SULFATE 40 G IN LACTATED RINGERS - SIMPLE
2.0000 g/h | INTRAVENOUS | Status: DC
  Filled 2018-09-15: qty 500

## 2018-09-15 MED ORDER — KETOROLAC TROMETHAMINE 30 MG/ML IJ SOLN
30.0000 mg | Freq: Once | INTRAMUSCULAR | Status: AC
  Administered 2018-09-15: 30 mg via INTRAVENOUS
  Filled 2018-09-15: qty 1
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
## 2018-09-15 NOTE — MAU Note (Signed)
Pt presents to MAU after having her blood pressure recheck at the office for labs. Pt reports headache, denies any vision changes

## 2018-09-15 NOTE — MAU Note (Signed)
PT left AMA but agreed to see her doctor tomorrow a.m. And to return to MAU if pre-E symptoms occur.  Patient signed form and left with pain at 2.

## 2018-09-15 NOTE — MAU Provider Note (Signed)
MD note  32 s/p SVD with elevated BP and HA.  In severe range on arrival, 170/105, decreased to 145-150/90's after 20mg  IV labetalol.  Plan to admit pt, however unable to do to childcare.  Some question of CHTN?  Labs are WNL.  Pr/Cr 0.20 not diagnostic for PreE.    I recommend admission, but likely preeclampsia.  Close f/u 9/19.    Gave toradol for HA D/c with procardia XL 30mg   See NP H&P  gen Pt tearful Reasonable, AMA Abd soft FNT  BP 150/90

## 2018-11-03 ENCOUNTER — Other Ambulatory Visit: Payer: Self-pay | Admitting: Internal Medicine

## 2018-11-11 ENCOUNTER — Ambulatory Visit: Payer: BLUE CROSS/BLUE SHIELD | Admitting: Internal Medicine

## 2018-12-19 ENCOUNTER — Other Ambulatory Visit: Payer: Self-pay | Admitting: Internal Medicine

## 2019-02-07 ENCOUNTER — Encounter (HOSPITAL_COMMUNITY): Payer: Self-pay | Admitting: Emergency Medicine

## 2019-02-07 ENCOUNTER — Emergency Department (HOSPITAL_COMMUNITY)
Admission: EM | Admit: 2019-02-07 | Discharge: 2019-02-07 | Disposition: A | Discharge status: Home / Self Care | Payer: BLUE CROSS/BLUE SHIELD | Attending: Emergency Medicine | Admitting: Emergency Medicine

## 2019-02-07 ENCOUNTER — Emergency Department (HOSPITAL_COMMUNITY): Payer: BLUE CROSS/BLUE SHIELD

## 2019-02-07 DIAGNOSIS — Y999 Unspecified external cause status: Secondary | ICD-10-CM | POA: Diagnosis not present

## 2019-02-07 DIAGNOSIS — W010XXA Fall on same level from slipping, tripping and stumbling without subsequent striking against object, initial encounter: Secondary | ICD-10-CM | POA: Insufficient documentation

## 2019-02-07 DIAGNOSIS — Y92481 Parking lot as the place of occurrence of the external cause: Secondary | ICD-10-CM | POA: Insufficient documentation

## 2019-02-07 DIAGNOSIS — F17210 Nicotine dependence, cigarettes, uncomplicated: Secondary | ICD-10-CM | POA: Insufficient documentation

## 2019-02-07 DIAGNOSIS — Z79899 Other long term (current) drug therapy: Secondary | ICD-10-CM | POA: Insufficient documentation

## 2019-02-07 DIAGNOSIS — S63501A Unspecified sprain of right wrist, initial encounter: Secondary | ICD-10-CM | POA: Insufficient documentation

## 2019-02-07 DIAGNOSIS — Y9302 Activity, running: Secondary | ICD-10-CM | POA: Insufficient documentation

## 2019-02-07 DIAGNOSIS — J449 Chronic obstructive pulmonary disease, unspecified: Secondary | ICD-10-CM | POA: Insufficient documentation

## 2019-02-07 DIAGNOSIS — Y9383 Activity, rough housing and horseplay: Secondary | ICD-10-CM | POA: Diagnosis not present

## 2019-02-07 DIAGNOSIS — S6991XA Unspecified injury of right wrist, hand and finger(s), initial encounter: Secondary | ICD-10-CM | POA: Diagnosis present

## 2019-02-07 DIAGNOSIS — W19XXXA Unspecified fall, initial encounter: Secondary | ICD-10-CM

## 2019-02-07 NOTE — Discharge Instructions (Signed)
You can take Tylenol or Ibuprofen as directed for pain. You can alternate Tylenol and Ibuprofen every 4 hours. If you take Tylenol at 1pm, then you can take Ibuprofen at 5pm. Then you can take Tylenol again at 9pm.   Follow the RICE (Rest, Ice, Compression, Elevation) protocol as directed.   Wear the splint for support and stabilization.  As we discussed, your x-rays today showed no evidence of broken bone.  You do have pain in a specific spot that could hide a small fracture.  Please follow-up with referred orthopedic doctor for further evaluation.  Return emergency department for any worsening pain, discoloration of the arm, numbness/weakness or any other worsening or concerning symptoms.

## 2019-02-07 NOTE — ED Notes (Signed)
Called x 2 no response

## 2019-02-07 NOTE — ED Notes (Signed)
Patient verbalizes understanding of discharge instructions. Opportunity for questioning and answers were provided. Armband removed by staff, pt discharged from ED ambulatory by self\  
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
## 2019-02-07 NOTE — ED Triage Notes (Signed)
Pt reports injuring her right wrist yesterday while chasing her son and falling in the parking lot

## 2022-09-22 ENCOUNTER — Encounter: Payer: Self-pay | Admitting: *Deleted
